# Patient Record
Sex: Male | Born: 1970 | Race: White | Hispanic: No | Marital: Married | Smoking: Never smoker
Health system: Southern US, Community
[De-identification: ages and names within clinical notes are randomized; demographics above are authoritative.]

## PROBLEM LIST (undated history)

## (undated) DIAGNOSIS — T7840XA Allergy, unspecified, initial encounter: Secondary | ICD-10-CM

## (undated) DIAGNOSIS — I82409 Acute embolism and thrombosis of unspecified deep veins of unspecified lower extremity: Secondary | ICD-10-CM

## (undated) DIAGNOSIS — Z8619 Personal history of other infectious and parasitic diseases: Secondary | ICD-10-CM

## (undated) DIAGNOSIS — J45909 Unspecified asthma, uncomplicated: Secondary | ICD-10-CM

## (undated) HISTORY — DX: Unspecified asthma, uncomplicated: J45.909

## (undated) HISTORY — DX: Allergy, unspecified, initial encounter: T78.40XA

## (undated) HISTORY — DX: Personal history of other infectious and parasitic diseases: Z86.19

## (undated) HISTORY — DX: Acute embolism and thrombosis of unspecified deep veins of unspecified lower extremity: I82.409

---

## 2009-12-15 ENCOUNTER — Encounter: Admission: RE | Admit: 2009-12-15 | Discharge: 2009-12-15 | Payer: Self-pay | Admitting: Unknown Physician Specialty

## 2011-12-08 IMAGING — US US EXTREM LOW VENOUS*L*
1 series · 14 of 24 positions shown · non-contrast
Comparison: None.

CLINICAL DATA: Left leg swelling, obesity

LEFT LOWER EXTREMITY VENOUS DUPLEX ULTRASOUND
TECHNIQUE: Gray-scale sonography with graded compression, as well
as color Doppler and duplex ultrasound were performed to evaluate
the deep venous system of the lower extremity from the level of the
common femoral vein through the popliteal and proximal calf veins.
Spectral Doppler was utilized to evaluate flow at rest and with
distal augmentation maneuvers.

[Series 1: us extrem low venous*left* · 14 of 33 slices shown]
[im 1/33]
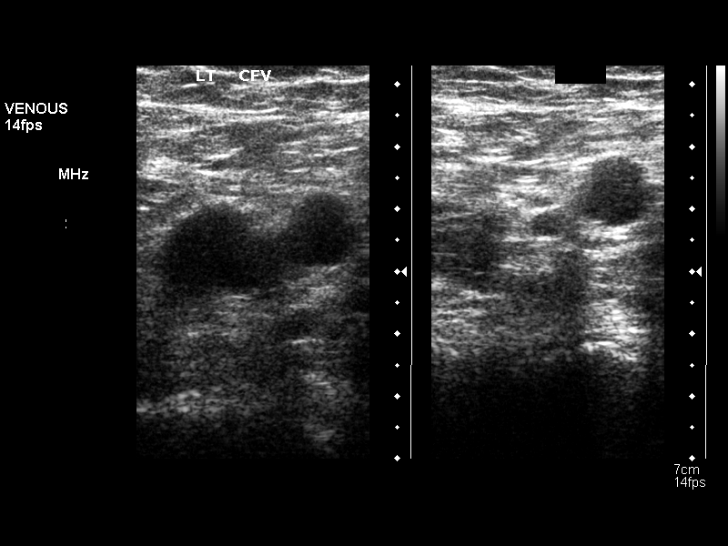
[im 3/33]
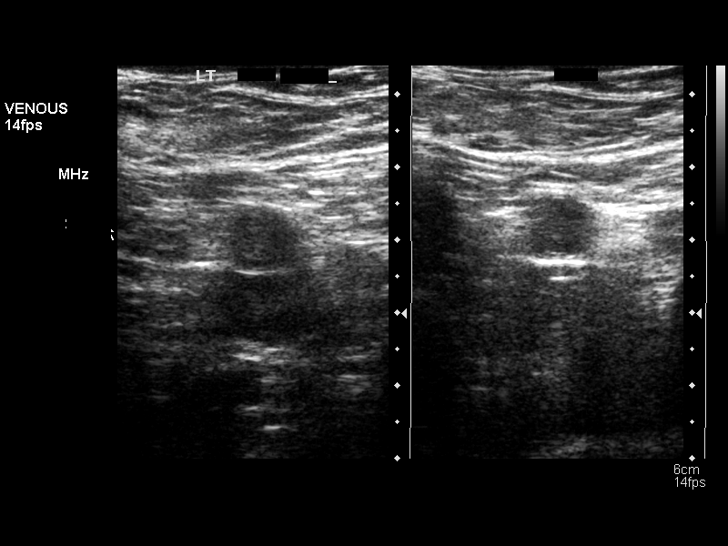
[im 6/33]
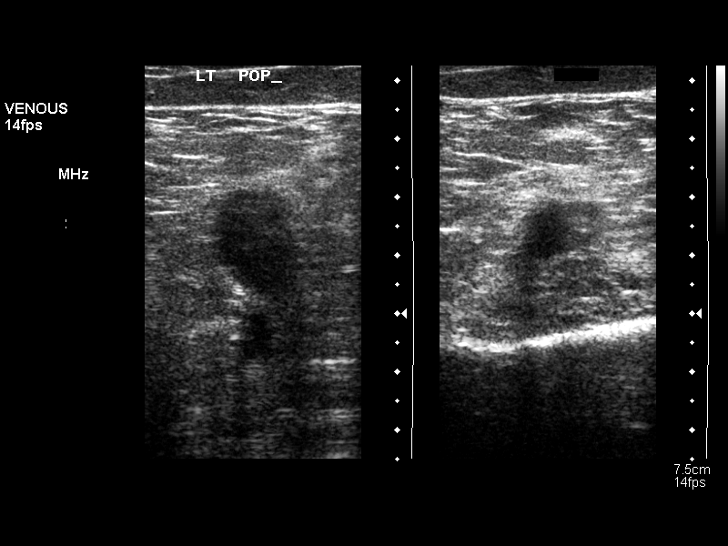
[im 9/33]
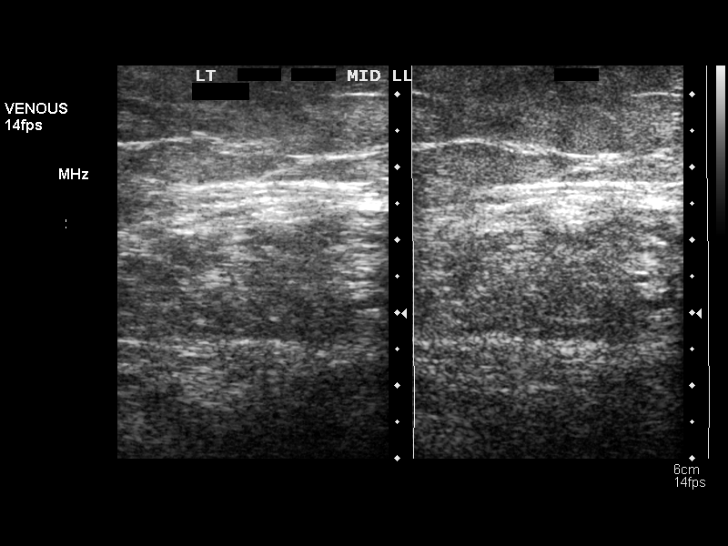
[im 10/33]
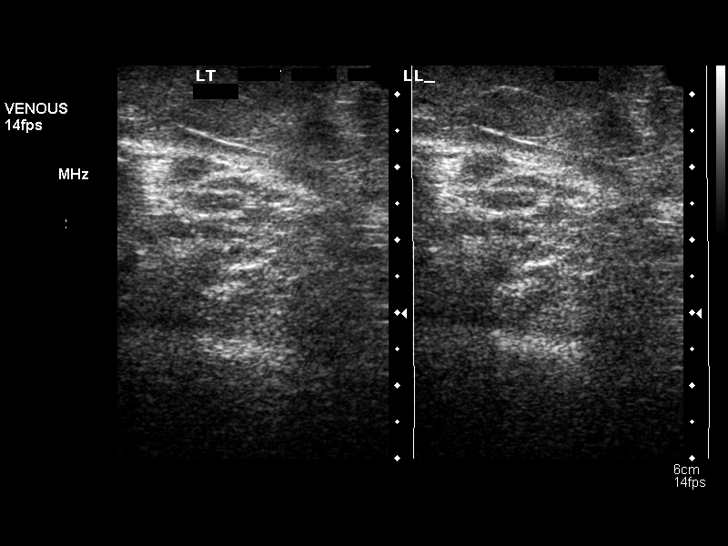
[im 13/33]
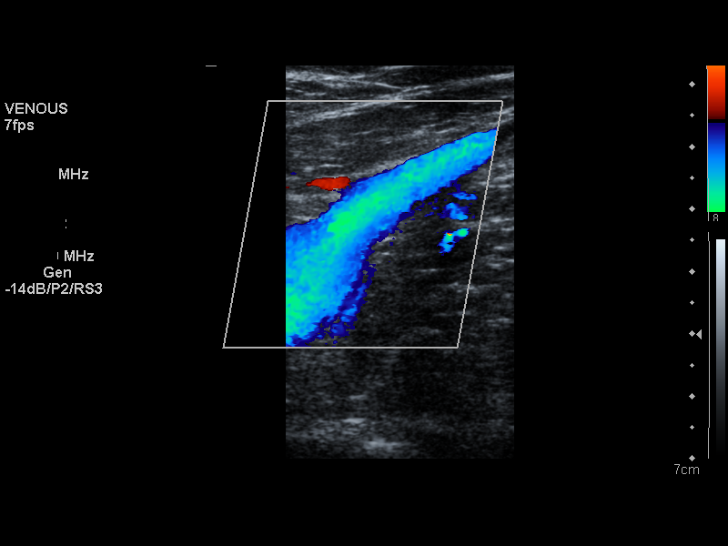
[im 16/33]
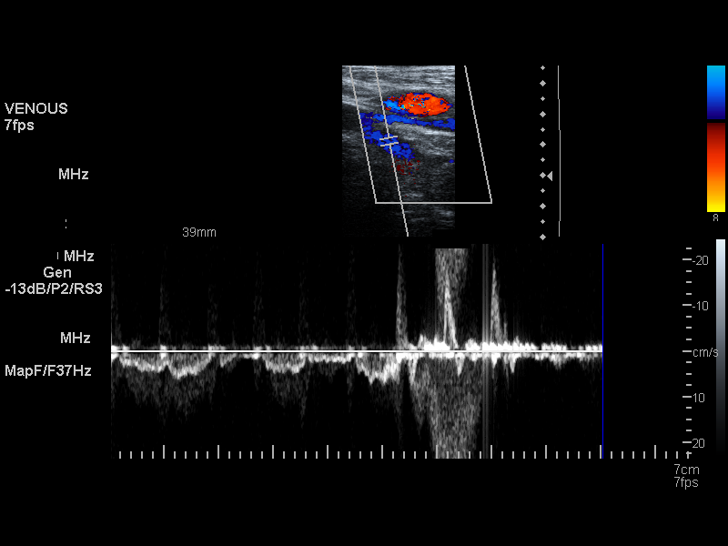
[im 17/33]
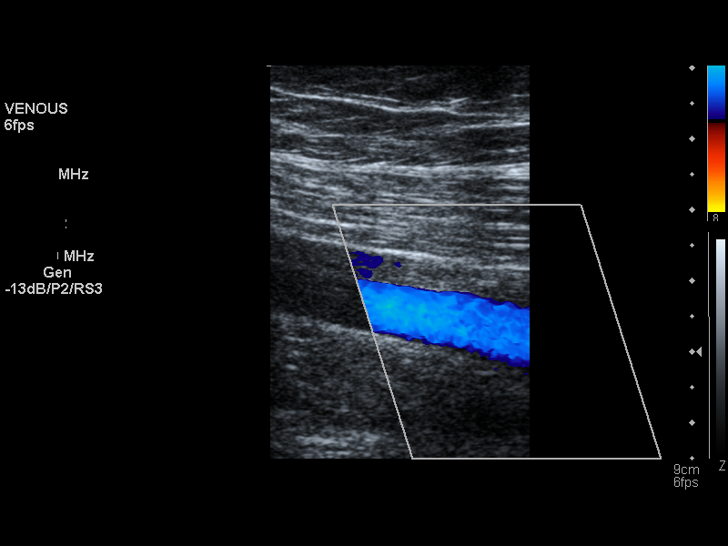
[im 20/33]
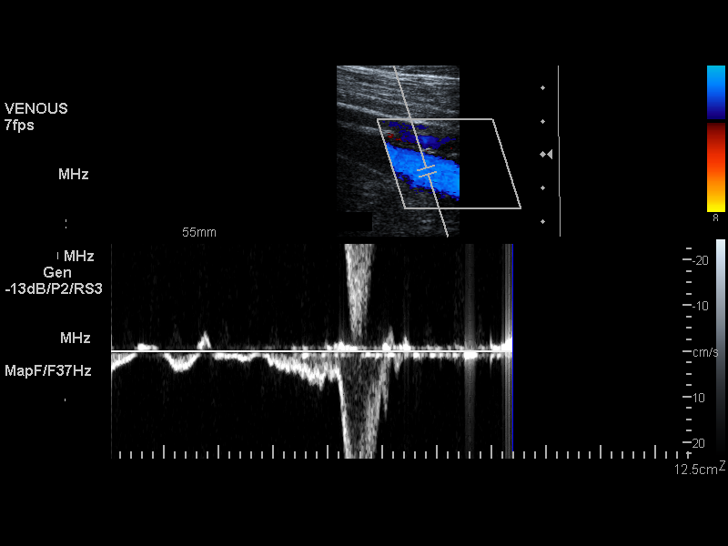
[im 23/33]
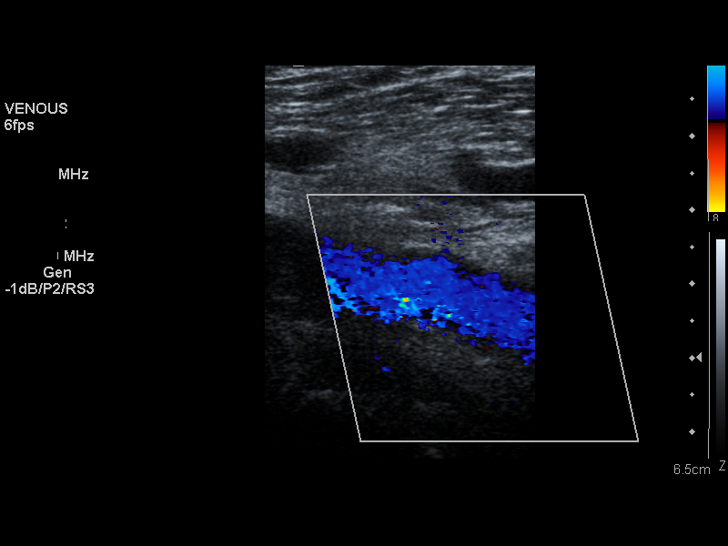
[im 26/33]
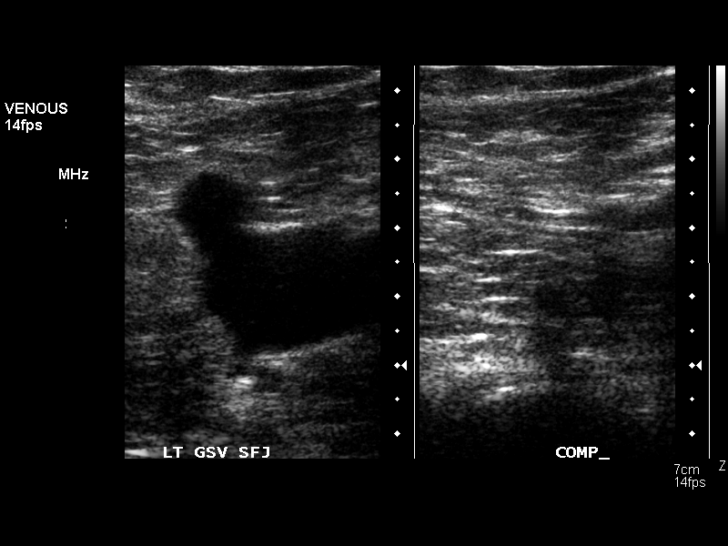
[im 27/33]
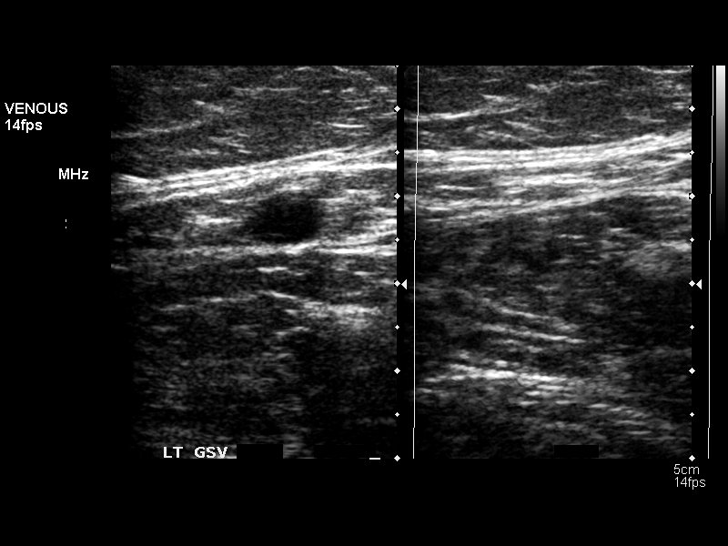
[im 30/33]
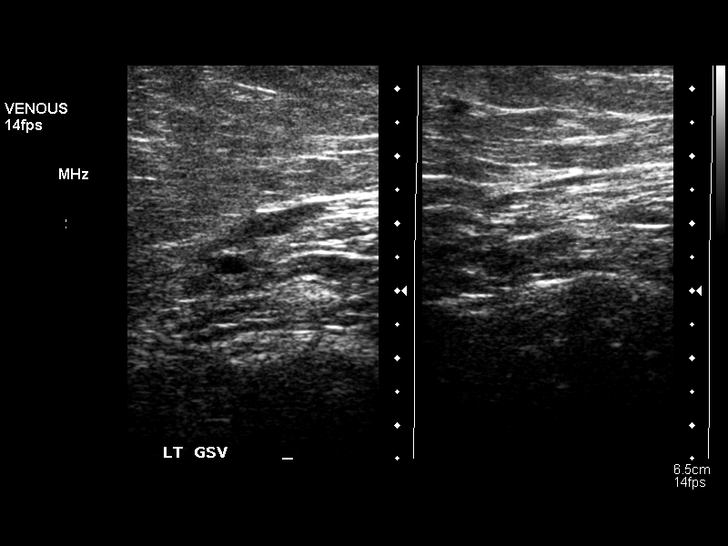
[im 33/33]
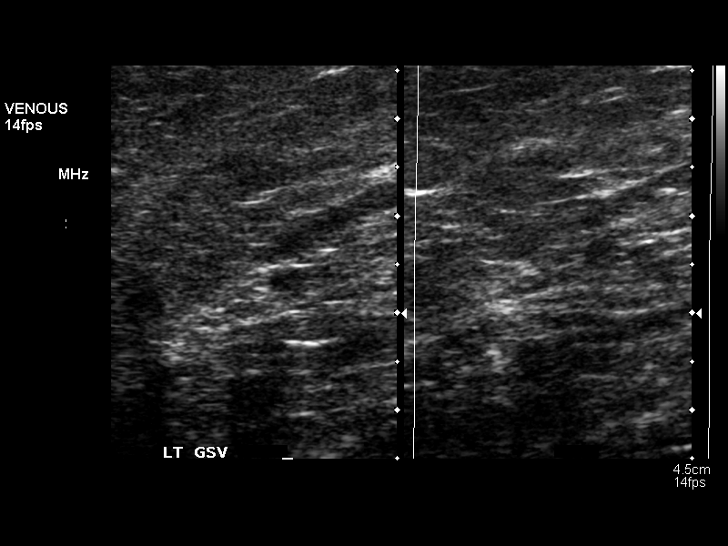

[14 of 24 positions shown; findings below may reference images not displayed]

FINDINGS: Normal compressibility of the common femoral,
superficial femoral, and popliteal veins is demonstrated, as well
as the visualized proximal calf veins.  No filling defects to
suggest DVT on grayscale or color Doppler imaging.  Doppler
waveforms show normal direction of venous flow, normal respiratory
phasicity and response to augmentation.
IMPRESSION: No evidence of lower extremity deep vein thrombosis.

## 2018-01-01 ENCOUNTER — Other Ambulatory Visit: Payer: Self-pay | Admitting: Podiatry

## 2018-01-01 ENCOUNTER — Ambulatory Visit (INDEPENDENT_AMBULATORY_CARE_PROVIDER_SITE_OTHER): Payer: BLUE CROSS/BLUE SHIELD

## 2018-01-01 ENCOUNTER — Encounter: Payer: Self-pay | Admitting: Podiatry

## 2018-01-01 ENCOUNTER — Ambulatory Visit: Payer: BLUE CROSS/BLUE SHIELD | Admitting: Podiatry

## 2018-01-01 ENCOUNTER — Other Ambulatory Visit: Payer: Self-pay

## 2018-01-01 VITALS — BP 139/89 | HR 68 | Resp 16

## 2018-01-01 DIAGNOSIS — M722 Plantar fascial fibromatosis: Secondary | ICD-10-CM | POA: Diagnosis not present

## 2018-01-01 DIAGNOSIS — M216X2 Other acquired deformities of left foot: Secondary | ICD-10-CM

## 2018-01-01 DIAGNOSIS — M7750 Other enthesopathy of unspecified foot: Secondary | ICD-10-CM | POA: Diagnosis not present

## 2018-01-01 DIAGNOSIS — M778 Other enthesopathies, not elsewhere classified: Secondary | ICD-10-CM

## 2018-01-01 DIAGNOSIS — M779 Enthesopathy, unspecified: Secondary | ICD-10-CM

## 2018-01-01 DIAGNOSIS — M2042 Other hammer toe(s) (acquired), left foot: Secondary | ICD-10-CM

## 2018-01-01 DIAGNOSIS — L84 Corns and callosities: Secondary | ICD-10-CM | POA: Diagnosis not present

## 2018-01-01 MED ORDER — MELOXICAM 15 MG PO TABS: 15.0000 mg | ORAL_TABLET | Freq: Every day | ORAL | 0 refills | Status: DC

## 2018-01-01 NOTE — Patient Instructions (Addendum)

## 2018-01-04 NOTE — Progress Notes (Signed)
Subjective:   Patient ID: Mark Gray, male   DOB: 47 y.o.   MRN: [id number]   HPI 47 year old male presents the office with concerns of pain to the bottom of his right heel which is been ongoing the last couple of months and he states this started after he eats a lot of walking while in [place].  He has pain in the morning he first gets up.  He has purchased over-the-counter inserts which help him primarily.  Denies any recent injury or trauma.  He also is a callus in the left foot submetatarsal 5.  This area is painful with pressure in shoes.  No swelling or redness that he has noticed.  No other issues today.   Review of Systems  All other systems reviewed and are negative.  No past medical history on file.     Current Outpatient Medications:  .  meloxicam (MOBIC) 15 MG tablet, Take 1 tablet (15 mg total) by mouth daily., Disp: 30 tablet, Rfl: 0  No Known Allergies       Objective:  Physical Exam  General: AAO x3, NAD  Dermatological: Hyperkeratotic lesion left fifth metatarsal.  Upon debridement there is no underlying ulceration, drainage or any clinical signs of infection present today.  There are no open sores, no preulcerative lesions, no rash or signs of infection present.  Vascular: Dorsalis Pedis artery and Posterior Tibial artery pedal pulses are 2/4 bilateral with immedate capillary fill time. Pedal hair growth present. No varicosities and no lower extremity edema present bilateral. There is no pain with calf compression, swelling, warmth, erythema.   Neruologic: Grossly intact via light touch bilateral. Vibratory intact via tuning fork bilateral. Protective threshold with Semmes Wienstein monofilament intact to all pedal sites bilateral.  Negative Tinel sign  Musculoskeletal: Tenderness to palpation along the plantar medial tubercle of the calcaneus at the insertion of plantar fascia on the right foot. There is no pain along the course of the plantar fascia  within the arch of the foot. Plantar fascia appears to be intact. There is no pain with lateral compression of the calcaneus or pain with vibratory sensation. There is no pain along the course or insertion of the achilles tendon.  Prominent metatarsal head left foot.  No other areas of tenderness to bilateral lower extremities. Muscular strength 5/5 in all groups tested bilateral.  Gait: Unassisted, Nonantalgic.       Assessment:   Right heel pain, bladder fasciitis with hyperkeratotic lesion left foot    Plan:  -Treatment options discussed including all alternatives, risks, and complications -Etiology of symptoms were discussed -X-rays were obtained and reviewed with the patient.  No evidence of acute fracture or stress fracture identified today. -Steroid injection was performed.  See procedure note below. -Prescribed mobic. Discussed side effects of the medication and directed to stop if any are to occur and call the office.  -Plantar fascial brace dispensed -Stretching, icing exercises daily -Discussed shoe modifications and orthotics.  I also debrided the hyperkeratotic lesion left foot without any complications or bleeding.  I discussed with custom orthotic may help take some pressure off this area as well.  Vivi Barrack DPM

## 2018-01-22 ENCOUNTER — Ambulatory Visit: Payer: BLUE CROSS/BLUE SHIELD | Admitting: Podiatry

## 2018-01-23 ENCOUNTER — Ambulatory Visit (INDEPENDENT_AMBULATORY_CARE_PROVIDER_SITE_OTHER): Payer: BLUE CROSS/BLUE SHIELD | Admitting: Podiatry

## 2018-01-23 ENCOUNTER — Encounter: Payer: Self-pay | Admitting: Podiatry

## 2018-01-23 DIAGNOSIS — M722 Plantar fascial fibromatosis: Secondary | ICD-10-CM | POA: Diagnosis not present

## 2018-01-23 MED ORDER — TRIAMCINOLONE ACETONIDE 10 MG/ML IJ SUSP: 10.0000 mg | Freq: Once | INTRAMUSCULAR | Status: AC

## 2018-01-23 NOTE — Progress Notes (Signed)
Subjective: Mark DrainWilliam Sweeney presents to the office today for follow-up evaluation of right heel pain. He states that he is doing better but still having pain. Overall he does state that the pain has improved. Even when it hurts the worst is better than it was. He has been stretching but not icing much. He did get new shoes that help. The braces to help. They have been icing, stretching, try to wear supportive shoe as much as possible. No other complaints at this time. No acute changes since last appointment. They deny any systemic complaints such as fevers, chills, nausea, vomiting.  Objective: General: AAO x3, NAD  Dermatological: Skin is warm, dry and supple bilateral. Nails x 10 are well manicured; remaining integument appears unremarkable at this time. There are no open sores, no preulcerative lesions, no rash or signs of infection present.  Vascular: Dorsalis Pedis artery and Posterior Tibial artery pedal pulses are 2/4 bilateral with immedate capillary fill time. Pedal hair growth present. There is no pain with calf compression, swelling, warmth, erythema.   Neruologic: Grossly intact via light touch bilateral.   Musculoskeletal: There is improved but continued tenderness palpation along the plantar medial tubercle of the calcaneus at the insertion of the plantar fascia on the  right foot. There is  no pain along the course of the plantar fascia within the arch of the foot. Plantar fascia appears to be intact bilaterally. There is no pain with lateral compression of the calcaneus and there is no pain with vibratory sensation. There is no pain along the course or insertion of the Achilles tendon. There are no other areas of tenderness to bilateral lower extremities. No gross boney pedal deformities bilateral. No pain, crepitus, or limitation noted with foot and ankle range of motion bilateral. Muscular strength 5/5 in all groups tested bilateral.  Gait: Unassisted, Nonantalgic.    Assessment: Presents for follow-up evaluation for heel pain, likely plantar fasciitis   Plan: -Treatment options discussed including all alternatives, risks, and complications -Steroid injection done today. This is his SECOND injection. See procedure note below -Continue with stretching and icing daily -Ice and stretching exercises on a daily basis. -Continue supportive shoe gear. He has OTC inserts. Consider custom if continuing to have symptoms.  -Follow-up in 3 weeks if symptoms continue or sooner if any problems arise. In the meantime, encouraged to call the office with any questions, concerns, change in symptoms.   Ovid CurdMatthew Storm Sovine, DPM

## 2018-01-28 ENCOUNTER — Other Ambulatory Visit: Payer: Self-pay | Admitting: Podiatry

## 2018-02-13 ENCOUNTER — Ambulatory Visit: Payer: BLUE CROSS/BLUE SHIELD | Admitting: Podiatry

## 2018-03-03 ENCOUNTER — Ambulatory Visit: Payer: BLUE CROSS/BLUE SHIELD | Admitting: Podiatry

## 2021-04-27 ENCOUNTER — Other Ambulatory Visit: Payer: Self-pay | Admitting: Family Medicine

## 2021-04-27 DIAGNOSIS — S0990XA Unspecified injury of head, initial encounter: Secondary | ICD-10-CM

## 2022-08-08 ENCOUNTER — Other Ambulatory Visit: Payer: Self-pay | Admitting: Internal Medicine

## 2022-08-08 ENCOUNTER — Ambulatory Visit (HOSPITAL_COMMUNITY)
Admission: RE | Admit: 2022-08-08 | Discharge: 2022-08-08 | Disposition: A | Discharge status: Home / Self Care | Payer: 59 | Source: Ambulatory Visit | Attending: Internal Medicine | Admitting: Internal Medicine

## 2022-08-08 DIAGNOSIS — M79604 Pain in right leg: Secondary | ICD-10-CM

## 2022-08-08 DIAGNOSIS — M79605 Pain in left leg: Secondary | ICD-10-CM

## 2022-08-08 NOTE — Progress Notes (Addendum)
VASCULAR LAB    Left lower extremity venous duplex has been performed.  See CV proc for preliminary results.  Called report to Marcy Salvo, PA-C  Murl Golladay, [facility], RVT 08/08/2022, 4:29 PM

## 2022-08-26 ENCOUNTER — Encounter: Payer: Self-pay | Admitting: Family Medicine

## 2022-08-26 ENCOUNTER — Ambulatory Visit: Payer: 59 | Admitting: Family Medicine

## 2022-08-26 VITALS — BP 108/74 | HR 65 | Temp 98.6°F | Ht 69.75 in | Wt 330.0 lb

## 2022-08-26 DIAGNOSIS — I82812 Embolism and thrombosis of superficial veins of left lower extremities: Secondary | ICD-10-CM | POA: Diagnosis not present

## 2022-08-26 DIAGNOSIS — I872 Venous insufficiency (chronic) (peripheral): Secondary | ICD-10-CM

## 2022-08-26 MED ORDER — APIXABAN 5 MG PO TABS: ORAL_TABLET | ORAL | 1 refills | Status: DC

## 2022-08-26 NOTE — Assessment & Plan Note (Addendum)
Patient had a superficial clot in the greater saphenous vein on the left from the mid calf to the distal thigh. We discussed the possible need for full anticoagulation given that this in the greater saphenous vein, >5 cm in length and it extends above the knee. he is

## 2022-08-26 NOTE — Patient Instructions (Signed)
[facility] -- [phone] -- call and ask about the referral placed.

## 2022-08-26 NOTE — Progress Notes (Unsigned)
New Patient Office Visit  Subjective    Patient ID: Mark Gray, male    DOB: 20-Nov-1970  Age: 52 y.o. MRN: [id number]  CC:  Chief Complaint  Patient presents with   Establish Care    HPI Mark Gray presents to establish care Patient was being seen by a provider at work, [employer]. He reports that he has ben having a month history of left leg pain, swelling and redness. He was seen on 08/08/22 and he was diagnosed with a superficial blood clot in the left leg. Pt reports he is taking an aspirin daily and he was told that he was getting referred to the vascular center.  Pt reports that he hasn't heard back from the vascular center with an appointment. He reports a long history of chronic swelling due to venous insufficiency, states that he normally wraps his legs with Ace bandages to help keep the swelling down. States that he has been using NSAIDS, hot compresses and massages on the medial calf and thigh, states that it has improved significantly since it was first diagnosed, however he thought that the hardness on the distal thigh was getting somewhat worse.   I reviewed the [place] and it showed a superfiical clot in the left greater saphenous vein from the calf all the way up to the distal thigh.  Outpatient Encounter Medications as of 08/26/2022  Medication Sig   albuterol (VENTOLIN HFA) 108 (90 Base) MCG/ACT inhaler Inhale into the lungs every 6 (six) hours as needed for wheezing or shortness of breath.   apixaban (ELIQUIS) 5 MG TABS tablet Start with 10 mg twice a day for 7 days, then reduce to 5 mg twice a day.   aspirin EC 81 MG tablet Take 81 mg by mouth daily. Swallow whole.   Cetirizine HCl (ZYRTEC PO) Take by mouth as needed.   Multiple Vitamins-Minerals (MULTIVITAMIN ADULTS PO) Take by mouth daily.   [DISCONTINUED] meloxicam (MOBIC) 15 MG tablet TAKE 1 TABLET BY MOUTH EVERY DAY   Facility-Administered Encounter Medications as of 08/26/2022  Medication   triamcinolone  acetonide (KENALOG) 10 MG/ML injection 10 mg    Past Medical History:  Diagnosis Date   Allergy    Asthma    DVT (deep venous thrombosis) (HCC)    LLE 07/2022 per patient   History of chickenpox     History reviewed. No pertinent surgical history.  Family History  Problem Relation Age of Onset   Cancer Mother    Heart attack Mother    CVA Father    Depression Son     Social History   Socioeconomic History   Marital status: Married    Spouse name: Not on file   Number of children: Not on file   Years of education: Not on file   Highest education level: Not on file  Occupational History   Not on file  Tobacco Use   Smoking status: Never   Smokeless tobacco: Never  Vaping Use   Vaping Use: Never used  Substance and Sexual Activity   Alcohol use: Yes    Comment: 1-2 drinks per week occasionally   Drug use: Never   Sexual activity: Yes  Other Topics Concern   Not on file  Social History Narrative   Not on file   Social Determinants of Health   Financial Resource Strain: Not on file  Food Insecurity: Not on file  Transportation Needs: Not on file  Physical Activity: Not on file  Stress: Not on file  Social Connections: Not on file  Intimate Partner Violence: Not on file    Review of Systems  All other systems reviewed and are negative.       Objective    BP 108/74 (BP Location: Left Arm, Patient Position: Sitting, Cuff Size: Large)   Pulse 65   Temp 98.6 F (37 C) (Oral)   Ht 5' 9.75" (1.772 m)   Wt (!) 330 lb (149.7 kg)   SpO2 98%   BMI 47.69 kg/m   Physical Exam Vitals reviewed.  Constitutional:      Appearance: Normal appearance. He is well-groomed. He is morbidly obese.  Neck:     Thyroid: No thyromegaly.  Cardiovascular:     Rate and Rhythm: Normal rate and regular rhythm.     Heart sounds: S1 normal and S2 normal. No murmur heard. Pulmonary:     Effort: Pulmonary effort is normal.     Breath sounds: Normal breath sounds and air  entry. No rales.  Abdominal:     General: Bowel sounds are normal.  Musculoskeletal:     Right lower leg: No edema.     Left lower leg: Edema present.       Legs:     Comments: Patient has chronic lymphedema changes around the left ankle with hyperpigmentation of the swollen area. There is a hard rope like vein extending up the medial side of the calf up to the distal thigh about the knee.  Neurological:     General: No focal deficit present.     Mental Status: He is alert and oriented to person, place, and time.     Gait: Gait is intact.  Psychiatric:        Mood and Affect: Mood and affect normal.         Assessment & Plan:   Problem List Items Addressed This Visit       Unprioritized   Acute superficial venous thrombosis of lower extremity, left - Primary (Chronic)    Patient had a superficial clot in the greater saphenous vein on the left from the mid calf to the distal thigh. We discussed the possible need for full anticoagulation given that this in the greater saphenous vein, >5 cm in length and it extends above the knee. he has already been referred to the vascular center and is waiting to hear back. I recommended that we go ahead and start full anticoagulation given that this superificial clot is at high risk of getting into the deep vein system given the quality and the location of the clot. Pt will start 10 mg Eliquis BID for 7 days, then reduce to 5 mg BID for 45 days. Further orders will be determined by the vascular specialists.       Relevant Medications   aspirin EC 81 MG tablet   apixaban (ELIQUIS) 5 MG TABS tablet   Venous insufficiency of both lower extremities    Chronic, with evidence of lymphedematous  skin changes in the left ankle. Pt was advised to continue compression therapy.       Relevant Medications   aspirin EC 81 MG tablet   apixaban (ELIQUIS) 5 MG TABS tablet    Return in about 10 weeks (around 11/04/2022) for follow up sometime this week for  his venous insufficiency.   [name], MD

## 2022-08-27 NOTE — Assessment & Plan Note (Signed)
Chronic, with evidence of lymphedematous  skin changes in the left ankle. Pt was advised to continue compression therapy.

## 2022-08-28 ENCOUNTER — Other Ambulatory Visit: Payer: Self-pay

## 2022-08-28 DIAGNOSIS — I872 Venous insufficiency (chronic) (peripheral): Secondary | ICD-10-CM

## 2022-08-30 ENCOUNTER — Ambulatory Visit: Payer: 59 | Admitting: Surgery

## 2022-08-30 ENCOUNTER — Encounter: Payer: Self-pay | Admitting: Surgery

## 2022-08-30 ENCOUNTER — Ambulatory Visit (HOSPITAL_COMMUNITY)
Admission: RE | Admit: 2022-08-30 | Discharge: 2022-08-30 | Disposition: A | Discharge status: Home / Self Care | Payer: 59 | Source: Ambulatory Visit | Attending: Cardiology | Admitting: Cardiology

## 2022-08-30 VITALS — BP 139/91 | HR 70 | Temp 98.4°F | Resp 20 | Ht 69.5 in | Wt 328.0 lb

## 2022-08-30 DIAGNOSIS — I872 Venous insufficiency (chronic) (peripheral): Secondary | ICD-10-CM | POA: Insufficient documentation

## 2022-08-30 DIAGNOSIS — I82432 Acute embolism and thrombosis of left popliteal vein: Secondary | ICD-10-CM

## 2022-08-30 NOTE — Progress Notes (Signed)
[facility]  Patient name: Mark Gray MRN: [id number] DOB: 10-20-1970 Sex: male   REQUESTING PROVIDER:    Blondell Reveal    REASON FOR CONSULT:    Venous reflux  HISTORY OF PRESENT ILLNESS:   Mark Gray is a 52 y.o. male, who is referred today for venous disease.  He has had approximate 10-year history of chronic left leg swelling.  He has tried compression socks in the past but they cut the circulation off of the ankle.  He has  edema and the right leg but it is not near as severe as the left.  There was no inciting factor.  Approximate 3 weeks ago, he noticed worsening swelling with a palpable cord.  He went to the emergency department and was diagnosed with superficial venous thrombophlebitis in the saphenous vein.  He was not started on anticoagulation at that time but saw his primary care provider several days ago who ultimately started Eliquis.  He comes in today for vascular valuation.  He got a repeat ultrasound that shows popliteal thrombus.  His DVT appears to be unprovoked.  There were no aggravating factors.  He has not had a prior DVT.  There is no family history.  He is obese, but has lost 25 pounds intentionally over the past year.  He is a non-smoker  PAST MEDICAL HISTORY    Past Medical History:  Diagnosis Date   Allergy    Asthma    DVT (deep venous thrombosis) (HCC)    LLE 07/2022 per patient   History of chickenpox      FAMILY HISTORY   Family History  Problem Relation Age of Onset   Cancer Mother    Heart attack Mother    CVA Father    Depression Son     SOCIAL HISTORY:   Social History   Socioeconomic History   Marital status: Married    Spouse name: Not on file   Number of children: Not on file   Years of education: Not on file   Highest education level: Not on file  Occupational History   Not on file  Tobacco Use   Smoking status: Never   Smokeless tobacco: Never  Vaping Use   Vaping Use:  Never used  Substance and Sexual Activity   Alcohol use: Yes    Comment: 1-2 drinks per week occasionally   Drug use: Never   Sexual activity: Yes  Other Topics Concern   Not on file  Social History Narrative   Not on file   Social Determinants of Health   Financial Resource Strain: Not on file  Food Insecurity: Not on file  Transportation Needs: Not on file  Physical Activity: Not on file  Stress: Not on file  Social Connections: Not on file  Intimate Partner Violence: Not on file    ALLERGIES:    Allergies  Allergen Reactions   Shellfish Allergy     Causes asthma attack    CURRENT MEDICATIONS:    Current Outpatient Medications  Medication Sig Dispense Refill   albuterol (VENTOLIN HFA) 108 (90 Base) MCG/ACT inhaler Inhale into the lungs every 6 (six) hours as needed for wheezing or shortness of breath.     apixaban (ELIQUIS) 5 MG TABS tablet Start with 10 mg twice a day for 7 days, then reduce to 5 mg twice a day. 60 tablet 1   aspirin EC 81 MG tablet Take 81 mg by mouth daily. Swallow whole.  Cetirizine HCl (ZYRTEC PO) Take by mouth as needed.     Multiple Vitamins-Minerals (MULTIVITAMIN ADULTS PO) Take by mouth daily.     Current Facility-Administered Medications  Medication Dose Route Frequency Provider Last Rate Last Admin   triamcinolone acetonide (KENALOG) 10 MG/ML injection 10 mg  10 mg Other Once Trula Slade, DPM        REVIEW OF SYSTEMS:   [X]  denotes positive finding, [ ]  denotes negative finding Cardiac  Comments:  Chest pain or chest pressure:    Shortness of breath upon exertion:    Short of breath when lying flat:    Irregular heart rhythm:        Vascular    Pain in calf, thigh, or hip brought on by ambulation:    Pain in feet at night that wakes you up from your sleep:     Blood clot in your veins:    Leg swelling:  x       Pulmonary    Oxygen at home:    Productive cough:     Wheezing:         Neurologic    Sudden  weakness in arms or legs:     Sudden numbness in arms or legs:     Sudden onset of difficulty speaking or slurred speech:    Temporary loss of vision in one eye:     Problems with dizziness:         Gastrointestinal    Blood in stool:      Vomited blood:         Genitourinary    Burning when urinating:     Blood in urine:        Psychiatric    Major depression:         Hematologic    Bleeding problems:    Problems with blood clotting too easily:        Skin    Rashes or ulcers:        Constitutional    Fever or chills:     PHYSICAL EXAM:   Vitals:   08/30/22 1414  BP: (!) 139/91  Pulse: 70  Resp: 20  Temp: 98.4 F (36.9 C)  SpO2: 95%  Weight: (!) 328 lb (148.8 kg)  Height: 5' 9.5" (1.765 m)    GENERAL: The patient is a well-nourished male, in no acute distress. The vital signs are documented above. CARDIAC: There is a regular rate and rhythm.  VASCULAR: Significant left leg edema with hyperpigmentation of the left medial ankle.  Pitting edema to the right leg but not near as severe PULMONARY: Nonlabored respirations MUSCULOSKELETAL: There are no major deformities or cyanosis. NEUROLOGIC: No focal weakness or paresthesias are detected. SKIN: See photo below PSYCHIATRIC: The patient has a normal affect.  STUDIES:   I have reviewed the following study: Left:  - Color duplex evaluation of the left lower extremity shows there is  thrombus in the popliteal vein, the gastrocnemius veins and the great  saphenous vein. The great saphenous vein thrombus appears to extend from  the mid calf to the mid/distal thigh.      ASSESSMENT and PLAN   Left leg DVT: This appears to be unprovoked.  He has been started on Eliquis.  I am making a referral to hematology for hypercoagulable workup.  Nothing suggest a underlying malignancy at this time.  I am also sending him to the DVT clinic for further management of his anticoagulation.  We are  going to try to get him into  compression socks once some of the edema goes down.  He was encouraged to keep his legs elevated when possible   Annamarie Major, IV, MD, FACS [facility] [phone] Pager [phone]

## 2022-09-05 ENCOUNTER — Telehealth (HOSPITAL_COMMUNITY): Payer: Self-pay

## 2022-09-06 ENCOUNTER — Inpatient Hospital Stay: Payer: 59

## 2022-09-06 ENCOUNTER — Inpatient Hospital Stay (HOSPITAL_BASED_OUTPATIENT_CLINIC_OR_DEPARTMENT_OTHER): Payer: 59 | Admitting: Physician Assistant

## 2022-09-06 ENCOUNTER — Inpatient Hospital Stay: Payer: 59 | Attending: Hematology and Oncology | Admitting: Hematology and Oncology

## 2022-09-06 ENCOUNTER — Encounter: Payer: Self-pay | Admitting: Hematology and Oncology

## 2022-09-06 ENCOUNTER — Other Ambulatory Visit: Payer: Self-pay

## 2022-09-06 VITALS — BP 135/83 | HR 76 | Temp 97.7°F | Resp 16 | Ht 69.0 in | Wt 329.0 lb

## 2022-09-06 DIAGNOSIS — Z7901 Long term (current) use of anticoagulants: Secondary | ICD-10-CM | POA: Insufficient documentation

## 2022-09-06 DIAGNOSIS — R55 Syncope and collapse: Secondary | ICD-10-CM

## 2022-09-06 DIAGNOSIS — I82432 Acute embolism and thrombosis of left popliteal vein: Secondary | ICD-10-CM

## 2022-09-06 LAB — ANTITHROMBIN III: AntiThromb III Func: 94 % (ref 75–120)

## 2022-09-06 NOTE — Progress Notes (Signed)
[name] CONSULT NOTE  Patient Care Team: [name], MD as PCP - General (Family Medicine)  CHIEF COMPLAINTS/PURPOSE OF CONSULTATION:  Hypercoagulable work up.  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  He is currently on anticoagulation with Eliquis, tolerating it very well.  No clear provoking factors. Physical examination today with no major concerns except chronic venous stasis changes and left lower extremity asymmetrical swelling which according to the patient has been there for almost 10 years.  We have discussed in detail about the virtual striate and the risk factors for DVT and PE.  He appears to have no significant family history of DVT.  We have recommended hypercoagulable workup since he travels internationally very often for his work and is keen to know the duration of anticoagulation and if he would benefit from long-term anticoagulation.   He also mentions having gotten the COVID booster shot recently within 4 weeks of this DVT. He understands that hypercoagulable workup may not yield any positive results.  Since this is his first episode of unprovoked DVT, he can consider anticoagulation for 3 to 6 months followed by baby aspirin.  If he does end up having a second episode of DVT/PE, he may need indefinite anticoagulation.  We have briefly discussed about risk factors for DVT/PE, risk of recurrence after completing anticoagulation.  Thank you for consulting [place] in the care of the patient. He was planning to obtain Eliquis refill from [facility]. We will call him 2 weeks to review results from today  During the blood draw he had a syncopal episode hence we could not obtain factor V Leiden mutation.  He could have this lab drawn at the DVT clinic.  HISTORY OF PRESENTING ILLNESS:   Mark Gray 52 y.o. male is here because of hypercoagulable work up.  This is a 52 year old male patient with no significant past medical history most recently diagnosed with left lower extremity DVT in the popliteal vein.  Many weeks prior to the presentation he traveled to [place] in the car for about 8 hours.  About 3 weeks after this trip he noticed that his left lower extremity which is already swollen at baseline has become even more swollen.  He had imaging which showed superficial vein thrombosis.  He was not started on any anticoagulation for SVT.  He then went to see his PCP who recommended anticoagulation for short while and given the SVT and extremity swelling.  He was also referred to vascular surgery and he had a repeat imaging which showed thrombus in the popliteal vein, gastrocnemius and great saphenous vein.  The great saphenous vein thrombus appears to extend from the mid calf to mid distal thigh.  He denies any obvious provoking factors.  No recent trauma or surgery.  He is obese at baseline however has been trying to lose weight intentionally, lost about 30 to 40 pounds in the past several months.  He has a sedentary job, as a Hotel manager for [employer].  He also travels internationally and has been on several international trips without any DVTs.  He is now on Eliquis and has been tolerating it really well.  He is hoping to go for trip and spring break with his kids.  No family history of known hypercoagulable disorders.  No testosterone supplementation.  Rest of the pertinent 10 point ROS reviewed and negative   MEDICAL HISTORY:  Past Medical History:  Diagnosis Date   Allergy    Asthma    DVT (deep venous thrombosis) (HCC)    LLE 07/2022 per patient   History of chickenpox     SURGICAL HISTORY: No past surgical history on file.  SOCIAL HISTORY: Social History   Socioeconomic History   Marital status: Married    Spouse name: Not on file    Number of children: Not on file   Years of education: Not on file   Highest education level: Not on file  Occupational History   Not on file  Tobacco Use   Smoking status: Never   Smokeless tobacco: Never  Vaping Use   Vaping Use: Never used  Substance and Sexual Activity   Alcohol use: Yes    Comment: 1-2 drinks per week occasionally   Drug use: Never   Sexual activity: Yes  Other Topics Concern   Not on file  Social History Narrative   Not on file   Social Determinants of Health   Financial Resource Strain: Not on file  Food Insecurity: Not on file  Transportation Needs: Not on file  Physical Activity: Not on file  Stress: Not on file  Social Connections: Not on file  Intimate Partner Violence: Not on file    FAMILY HISTORY: Family History  Problem Relation Age of Onset   Cancer Mother    Heart attack Mother    CVA Father    Depression Son     ALLERGIES:  is allergic to shellfish allergy.  MEDICATIONS:  Current Outpatient Medications  Medication Sig Dispense Refill   albuterol (VENTOLIN HFA) 108 (90 Base) MCG/ACT inhaler Inhale into the lungs every 6 (six) hours as needed for wheezing or shortness of breath.     apixaban (ELIQUIS) 5 MG TABS tablet Start with 10 mg twice a day for 7 days, then reduce to 5 mg twice a day. 60 tablet 1   aspirin EC 81 MG tablet Take 81 mg by mouth daily. Swallow whole.     Cetirizine HCl (ZYRTEC PO) Take by mouth as needed.     Multiple Vitamins-Minerals (MULTIVITAMIN ADULTS PO) Take by mouth daily.     Current Facility-Administered Medications  Medication Dose Route Frequency Provider Last Rate Last Admin   triamcinolone acetonide (KENALOG) 10 MG/ML injection 10 mg  10 mg Other Once Vivi Barrack, DPM         PHYSICAL EXAMINATION: ECOG PERFORMANCE STATUS: 0 - Asymptomatic  Vitals:   09/06/22 1052  BP: 135/83  Pulse: 76  Resp: 16  Temp: 97.7 F (36.5 C)  SpO2: 100%   Filed Weights   09/06/22 1052  Weight:  (!) 329 lb (149.2 kg)    GENERAL:alert, no distress and comfortable, obese EYES: normal, conjunctiva are pink and non-injected, sclera clear NECK: supple,  non-tender, without nodularity LYMPH:  no palpable lymphadenopathy in the cervical, axillary  LUNGS: clear to auscultation and percussion with normal breathing effort HEART: regular rate & rhythm and no murmurs and no lower extremity edema ABDOMEN:abdomen soft, non-tender and normal bowel sounds Musculoskeletal:no cyanosis of digits and no clubbing  PSYCH: alert & oriented x 3 with fluent speech NEURO: no focal motor/sensory deficits Lower extremities: Bilateral lower extremities inspected.  He has at baseline left lower extremity swelling with venous stasis and hyperpigmentation.  He says since initiation of anticoagulation, the swelling is improved.  Right lower extremity  also has some edema but is pretty much at baseline. LABORATORY DATA:  I have reviewed the data as listed No results found for: "WBC", "HGB", "HCT", "MCV", "PLT"   Chemistry   No results found for: "NA", "K", "CL", "CO2", "BUN", "CREATININE", "GLU" No results found for: "CALCIUM", "ALKPHOS", "AST", "ALT", "BILITOT"     RADIOGRAPHIC STUDIES: I have personally reviewed the radiological images as listed and agreed with the findings in the report. VAS US LOWER EXTREMITY VENOUS REFLUX  Result Date: 08/30/2022  Lower Venous Reflux Study Patient Name:  Stacey DrainWILLIAM Freelove  Date of Exam:   08/30/2022 Medical Rec #: [id number]          Accession #:    [id number] Date of Birth: 04/28/1971          Patient Gender: M Patient Age:   [age] years Exam Location:  [facility] Procedure:      VAS US LOWER EXTREMITY VENOUS REFLUX Referring Phys: JOSHUA ROBINS --------------------------------------------------------------------------------  Other Indications: Patient presents for evaluation of GSV thrombus and venous                    insufficiency of the left leg. He was found to have thrombus                     in the left GSV that extends from the mid calf to the distal                    thigh on 08/08/22. He was not started on Eliquis until a few                    days ago after he saw a different doctor who recommended                    anticoagulation and referred him to [facility]. Anticoagulation: Eliquis. Comparison Study: [facility] lower [facility]                   [place] on 08/08/22 showed acute superficial vein                   thrombosis of the left great saphenous vein extending from the                   mid calf to the distal thigh. Performing Technologist: Olegario Sheareranielle Schmitt RVT  Examination Guidelines: A complete evaluation includes B-mode imaging, spectral Doppler, color Doppler, and power Doppler as needed of all accessible portions of each vessel. Bilateral testing is considered an integral part of a complete examination. Limited examinations for reoccurring indications may be performed as noted. The reflux portion of the exam is performed with the patient in reverse Trendelenburg. Significant venous reflux is defined as >500 ms in the superficial venous system, and >1 second in the deep venous system.  +--------------+---------+------+-----------+------------+--------------+ LEFT          Reflux NoRefluxReflux TimeDiameter cmsComments                               Yes                                        +--------------+---------+------+-----------+------------+--------------+ CFV  no                                                   +--------------+---------+------+-----------+------------+--------------+ FV prox       no                                                   +--------------+---------+------+-----------+------------+--------------+ FV mid        no                                                   +--------------+---------+------+-----------+------------+--------------+ FV  dist       no                                                   +--------------+---------+------+-----------+------------+--------------+ Popliteal     no                                    acute thrombus +--------------+---------+------+-----------+------------+--------------+ GSV at [facility]    no                            0.64                   +--------------+---------+------+-----------+------------+--------------+ GSV prox thighno                            0.48                   +--------------+---------+------+-----------+------------+--------------+ GSV mid thigh                                       acute thrombus +--------------+---------+------+-----------+------------+--------------+ GSV dist thigh                                      acute thrombus +--------------+---------+------+-----------+------------+--------------+ GSV at knee                                         acute thrombus +--------------+---------+------+-----------+------------+--------------+ GSV prox calf                                       acute thrombus +--------------+---------+------+-----------+------------+--------------+ GSV mid calf  acute thrombus +--------------+---------+------+-----------+------------+--------------+ SSV prox calf no                                                   +--------------+---------+------+-----------+------------+--------------+  Findings reported to Dr. Myra Gianotti and Thereasa Parkin at 12:15 pm.  Summary: Left: - Color duplex evaluation of the left lower extremity shows there is thrombus in the popliteal vein, the gastrocnemius veins and the great saphenous vein. The great saphenous vein thrombus appears to extend from the mid calf to the mid/distal thigh.  Recommendations: VASCULAR CONSULT IS INDICATED. Patient is seeing Dr. Linnell Fulling 08/30/22 at 2:20 pm.  *See table(s) above for measurements and  observations. Electronically signed by Sherald Hess MD on 08/30/2022 at 2:07:08 PM.    Final    VAS [place] LOWER EXTREMITY VENOUS (DVT)  Result Date: 08/09/2022  Lower Venous DVT Study Patient Name:  EATHEN BUDREAU  Date of Exam:   08/08/2022 Medical Rec #: [id number]          Accession #:    [id number] Date of Birth: 01-05-1971          Patient Gender: M Patient Age:   30 years Exam Location:  [facility] Procedure:      VAS [place] LOWER EXTREMITY VENOUS (DVT) Referring Phys: Blondell Reveal --------------------------------------------------------------------------------  Indications: Pain, Swelling, Erythema, and Palpable Cord.  Comparison Study: No prior study on file Performing Technologist: Sherren Kerns RVS  Examination Guidelines: A complete evaluation includes B-mode imaging, spectral Doppler, color Doppler, and power Doppler as needed of all accessible portions of each vessel. Bilateral testing is considered an integral part of a complete examination. Limited examinations for reoccurring indications may be performed as noted. The reflux portion of the exam is performed with the patient in reverse Trendelenburg.  +-----+---------------+---------+-----------+----------+--------------+ RIGHTCompressibilityPhasicitySpontaneityPropertiesThrombus Aging +-----+---------------+---------+-----------+----------+--------------+ CFV  Full           Yes      Yes                                 +-----+---------------+---------+-----------+----------+--------------+   +---------+---------------+---------+-----------+----------+--------------+ LEFT     CompressibilityPhasicitySpontaneityPropertiesThrombus Aging +---------+---------------+---------+-----------+----------+--------------+ CFV      Full           Yes      Yes                                 +---------+---------------+---------+-----------+----------+--------------+ SFJ      Full                                                         +---------+---------------+---------+-----------+----------+--------------+ FV Prox  Full                                                        +---------+---------------+---------+-----------+----------+--------------+ FV Mid   Full                                                        +---------+---------------+---------+-----------+----------+--------------+  FV DistalFull                                                        +---------+---------------+---------+-----------+----------+--------------+ PFV      Full                                                        +---------+---------------+---------+-----------+----------+--------------+ POP      Full           Yes      Yes                                 +---------+---------------+---------+-----------+----------+--------------+ PTV      Full                                                        +---------+---------------+---------+-----------+----------+--------------+ PERO     Full                                                        +---------+---------------+---------+-----------+----------+--------------+ GSV      None                                         Acute          +---------+---------------+---------+-----------+----------+--------------+ GSV thrombus extends mid calf to distal thigh along the site of roping and erythema   Summary: RIGHT: - No evidence of common femoral vein obstruction.  LEFT: - Findings consistent with acute superficial vein thrombosis involving the left great saphenous vein. - There is no evidence of deep vein thrombosis in the lower extremity.  *See table(s) above for measurements and observations. Electronically signed by Deitra Mayo MD on 08/09/2022 at 8:11:41 AM.    Final     All questions were answered. The patient knows to call the clinic with any problems, questions or concerns. I spent 45 minutes in the care of this patient  including H and P, review of records, counseling and coordination of care.     Benay Pike, MD 09/06/2022 11:04 AM

## 2022-09-06 NOTE — Progress Notes (Signed)
Symptom Management Consult note [place]    Patient Care Team: Farrel Conners, MD as PCP - General (Family Medicine)    Name of the patient: Mark Gray  [id number]  01/09/1971   Date of visit: 09/06/2022   Chief Complaint/Reason for visit: vasovagal episode      ASSESSMENT & PLAN: Patient is a 52 y.o. male  who recently established care with Dr. Chryl Heck for hypercoagulable work up.    #) Hypercoagulable work up -  See full note from MD visit earlier today.  #) Vasovagal episode - Happened during lab draw. Had prodrome of feeling hot and dizzy. -Patient was having 14 lab tubes drawn and this happened during tube #12 when patient admitted to feeling nervous about how much blood was being drawn. -Vitals were stable and glucose 144. Patient given snack and soda and observed for 30 minutes.  -On reassessment patient is asymptomatic, at baseline and stable for discharge home.  MD was made aware.       Heme/Onc History: Oncology History   No history exists.      Interval history-: Jerris Fleer is a 52 y.o. male who recently establish care at the cancer center for hypercoagulable workup seen today in [facility] after a vasovagal episode happening just prior to exam.  Patient was having blood work drawn when this happened.  He was having 14 tubes collected and during the 12th tube he suddenly felt hot and dizzy.  Patient then had a vasovagal episode and came back to when the lab technician said his name.  This lasted less than 10 seconds.  At the time of my exam patient is asymptomatic and feels like his typical self.  He admitted to feeling anxious about being at the cancer center and having the blood work drawn especially with how many tubes were being collected.  He states he has been in good health lately, no recent illness.  He did add that he fasted for today's lab work because he was unsure if that needed to be done or not.  He denies any  drug or alcohol use.      ROS  All other systems are reviewed and are negative for acute change except as noted in the HPI.    Allergies  Allergen Reactions   Shellfish Allergy     Causes asthma attack     Past Medical History:  Diagnosis Date   Allergy    Asthma    DVT (deep venous thrombosis) (HCC)    LLE 07/2022 per patient   History of chickenpox      No past surgical history on file.  Social History   Socioeconomic History   Marital status: Married    Spouse name: Not on file   Number of children: Not on file   Years of education: Not on file   Highest education level: Not on file  Occupational History   Not on file  Tobacco Use   Smoking status: Never   Smokeless tobacco: Never  Vaping Use   Vaping Use: Never used  Substance and Sexual Activity   Alcohol use: Yes    Comment: 1-2 drinks per week occasionally   Drug use: Never   Sexual activity: Yes  Other Topics Concern   Not on file  Social History Narrative   Not on file   Social Determinants of Health   Financial Resource Strain: Not on file  Food Insecurity: Not on file  Transportation  Needs: Not on file  Physical Activity: Not on file  Stress: Not on file  Social Connections: Not on file  Intimate Partner Violence: Not on file    Family History  Problem Relation Age of Onset   Cancer Mother    Heart attack Mother    CVA Father    Depression Son      Current Outpatient Medications:    albuterol (VENTOLIN HFA) 108 (90 Base) MCG/ACT inhaler, Inhale into the lungs every 6 (six) hours as needed for wheezing or shortness of breath., Disp: , Rfl:    apixaban (ELIQUIS) 5 MG TABS tablet, Start with 10 mg twice a day for 7 days, then reduce to 5 mg twice a day., Disp: 60 tablet, Rfl: 1   aspirin EC 81 MG tablet, Take 81 mg by mouth daily. Swallow whole., Disp: , Rfl:    Cetirizine HCl (ZYRTEC PO), Take by mouth as needed., Disp: , Rfl:    Multiple Vitamins-Minerals (MULTIVITAMIN ADULTS  PO), Take by mouth daily., Disp: , Rfl:   Current Facility-Administered Medications:    triamcinolone acetonide (KENALOG) 10 MG/ML injection 10 mg, 10 mg, Other, Once, Wagoner, Lesia Sago, DPM  PHYSICAL EXAM: ECOG FS:1 - Symptomatic but completely ambulatory  T: 97.7  BP: 125/71 HR: 62  Resp:16 SpO2: 100% RA Physical Exam Vitals and nursing note reviewed.  Constitutional:      Appearance: He is well-developed. He is obese. He is not ill-appearing or toxic-appearing.  HENT:     Head: Normocephalic.     Nose: Nose normal.  Eyes:     Conjunctiva/sclera: Conjunctivae normal.  Neck:     Vascular: No JVD.  Cardiovascular:     Rate and Rhythm: Normal rate and regular rhythm.     Pulses: Normal pulses.     Heart sounds: Normal heart sounds.  Pulmonary:     Effort: Pulmonary effort is normal.     Breath sounds: Normal breath sounds.  Abdominal:     General: There is no distension.  Musculoskeletal:     Cervical back: Normal range of motion.  Skin:    General: Skin is warm and dry.  Neurological:     Mental Status: He is oriented to person, place, and time.        LABORATORY DATA: I have reviewed the data as listed     No data to display               No data to display             RADIOGRAPHIC STUDIES (from last 24 hours if applicable) I have personally reviewed the radiological images as listed and agreed with the findings in the report. No results found.      Visit Diagnosis: 1. Acute deep vein thrombosis (DVT) of popliteal vein of left lower extremity (HCC)   2. Vasovagal episode      Orders Placed This Encounter  Procedures   Factor 5 Leiden*    Standing Status:   Future    Number of Occurrences:   1    Standing Expiration Date:   09/06/2023    All questions were answered. The patient knows to call the clinic with any problems, questions or concerns. No barriers to learning was detected.  I have spent a total of 10 minutes minutes of  face-to-face and non-face-to-face time, preparing to see the patient, obtaining and/or reviewing separately obtained history, performing a medically appropriate examination, counseling and educating the patient, ordering tests,  documenting clinical information in the electronic health record, and care coordination (communications with other health care professionals or caregivers).    Thank you for allowing me to participate in the care of this patient.    Barrie Folk, PA-C Department of Hematology/Oncology [facility] at [facility] Phone: [phone]  [phone]    09/06/2022 3:23 PM

## 2022-09-06 NOTE — Progress Notes (Signed)
CBG 144, Mark Gray. PA made aware.

## 2022-09-07 LAB — BETA-2-GLYCOPROTEIN I ABS, IGG/M/A
Beta-2 Glyco I IgG: <9 GPI IgG units (ref 0–20)
Beta-2-Glycoprotein I IgA: <9 GPI IgA units (ref 0–25)
Beta-2-Glycoprotein I IgM: <9 GPI IgM units (ref 0–32)

## 2022-09-08 LAB — CARDIOLIPIN ANTIBODIES, IGG, IGM, IGA
Anticardiolipin IgA: <9 APL U/mL (ref 0–11)
Anticardiolipin IgG: <9 GPL U/mL (ref 0–14)
Anticardiolipin IgM: <9 MPL U/mL (ref 0–12)

## 2022-09-08 LAB — LUPUS ANTICOAGULANT PANEL
DRVVT: 50.8 s — ABNORMAL HIGH (ref 0.0–47.0)
PTT Lupus Anticoagulant: 35.7 s (ref 0.0–43.5)

## 2022-09-08 LAB — PROTEIN S ACTIVITY: Protein S Activity: 93 % (ref 63–140)

## 2022-09-08 LAB — PROTEIN C ACTIVITY: Protein C Activity: 123 % (ref 73–180)

## 2022-09-08 LAB — DRVVT MIX: dRVVT Mix: 44.2 s — ABNORMAL HIGH (ref 0.0–40.4)

## 2022-09-08 LAB — DRVVT CONFIRM: dRVVT Confirm: 1 ratio (ref 0.8–1.2)

## 2022-09-09 LAB — GLUCOSE, CAPILLARY: Glucose-Capillary: 144 mg/dL — ABNORMAL HIGH (ref 70–99)

## 2022-09-12 LAB — FACTOR 5 LEIDEN

## 2022-09-12 LAB — HEXAGONAL PHOSPHOLIPID NEUTRALIZATION: Hexagonal Phospholipid Neutral: 11 s

## 2022-09-16 LAB — PROTHROMBIN GENE MUTATION

## 2022-09-19 ENCOUNTER — Inpatient Hospital Stay: Payer: 59 | Attending: Hematology and Oncology | Admitting: Hematology and Oncology

## 2022-09-19 ENCOUNTER — Encounter: Payer: Self-pay | Admitting: Hematology and Oncology

## 2022-09-19 DIAGNOSIS — I82432 Acute embolism and thrombosis of left popliteal vein: Secondary | ICD-10-CM

## 2022-09-19 NOTE — Progress Notes (Signed)
Mark Gray NOTE  Patient Care Team: Mark Conners, MD as PCP - General (Family Medicine)  CHIEF COMPLAINTS/PURPOSE OF CONSULTATION:  Hypercoagulable work up.  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  He is currently on anticoagulation with Eliquis, tolerating it very well.  No clear provoking factors. He is here for telephone visit, to review hypercoagulable work up results.  He is doing quite well.  We have discussed that hypercoagulable workup is negative today and the recommended duration of anticoagulation still remains about 3 to 6 months.  He will also follow-up with the DVT clinic as scheduled. At this time he does not have to follow-up with hematology on a routine basis.  He understands the need for indefinite anticoagulation in case of a second episode of DVT/PE.  All his questions were answered to the best my knowledge.  HISTORY OF PRESENTING ILLNESS:  Mark Gray 52 y.o. male is here because of hypercoagulable work up.  This is a 52 year old male patient with no significant past medical history most recently diagnosed with left lower extremity DVT in the popliteal vein.  Many weeks prior to the presentation he traveled to [place] in the car for about 8 hours.  About 3 weeks after this trip he noticed that his left lower extremity which is already swollen at baseline has become even more swollen.  He had imaging which showed superficial vein thrombosis.  He was not started on any anticoagulation for SVT.  He then went to see his PCP who recommended anticoagulation for short while and given the SVT and extremity swelling.  He was also referred to vascular surgery and he had a repeat imaging which showed thrombus in the popliteal vein, gastrocnemius and great saphenous vein.   The great saphenous vein thrombus appears to extend from the mid calf to mid distal thigh.  He is here for telephone visit to review hypercoagulable workup results.  No new concerns.   MEDICAL HISTORY:  Past Medical History:  Diagnosis Date   Allergy    Asthma    DVT (deep venous thrombosis) (HCC)    LLE 07/2022 per patient   History of chickenpox     SURGICAL HISTORY: No past surgical history on file.  SOCIAL HISTORY: Social History   Socioeconomic History   Marital status: Married    Spouse name: Not on file   Number of children: Not on file   Years of education: Not on file   Highest education level: Not on file  Occupational History   Not on file  Tobacco Use   Smoking status: Never   Smokeless tobacco: Never  Vaping Use   Vaping Use: Never used  Substance and Sexual Activity   Alcohol use: Yes    Comment: 1-2 drinks per week occasionally   Drug use: Never   Sexual activity: Yes  Other Topics Concern   Not on file  Social History Narrative   Not on file   Social Determinants of Health   Financial Resource Strain: Not on file  Food Insecurity: Not on file  Transportation Needs: Not on file  Physical Activity: Not on file  Stress: Not on file  Social Connections: Not on file  Intimate Partner Violence: Not on file    FAMILY HISTORY: Family History  Problem Relation Age of Onset  Cancer Mother    Heart attack Mother    CVA Father    Depression Son     ALLERGIES:  is allergic to shellfish allergy.  MEDICATIONS:  Current Outpatient Medications  Medication Sig Dispense Refill   albuterol (VENTOLIN HFA) 108 (90 Base) MCG/ACT inhaler Inhale into the lungs every 6 (six) hours as needed for wheezing or shortness of breath.     apixaban (ELIQUIS) 5 MG TABS tablet Start with 10 mg twice a day for 7 days, then reduce to 5 mg twice a day. 60 tablet 1   aspirin EC 81 MG tablet Take 81 mg by mouth daily. Swallow whole.     Cetirizine HCl (ZYRTEC PO) Take  by mouth as needed.     Multiple Vitamins-Minerals (MULTIVITAMIN ADULTS PO) Take by mouth daily.     Current Facility-Administered Medications  Medication Dose Route Frequency Provider Last Rate Last Admin   triamcinolone acetonide (KENALOG) 10 MG/ML injection 10 mg  10 mg Other Once Trula Slade, DPM         PHYSICAL EXAMINATION: ECOG PERFORMANCE STATUS: 0 - Asymptomatic  There were no vitals filed for this visit.  There were no vitals filed for this visit.   Physical exam deferred, telephone visit LABORATORY DATA:  I have reviewed the data as listed No results found for: "WBC", "HGB", "HCT", "MCV", "PLT"   Chemistry   No results found for: "NA", "K", "CL", "CO2", "BUN", "CREATININE", "GLU" No results found for: "CALCIUM", "ALKPHOS", "AST", "ALT", "BILITOT"     RADIOGRAPHIC STUDIES: I have personally reviewed the radiological images as listed and agreed with the findings in the report. VAS [place] LOWER EXTREMITY VENOUS REFLUX  Result Date: 08/30/2022  Lower Venous Reflux Study Patient Name:  Mark Gray  Date of Exam:   08/30/2022 Medical Rec #: [id number]          Accession #:    [id number] Date of Birth: 08-Aug-1971          Patient Gender: M Patient Age:   17 years Exam Location:  [facility] Procedure:      VAS [place] LOWER EXTREMITY VENOUS REFLUX Referring Phys: JOSHUA ROBINS --------------------------------------------------------------------------------  Other Indications: Patient presents for evaluation of GSV thrombus and venous                    insufficiency of the left leg. He was found to have thrombus                    in the left GSV that extends from the mid calf to the distal                    thigh on 08/08/22. He was not started on Eliquis until a few                    days ago after he saw a different doctor who recommended                    anticoagulation and referred him to [facility]. Anticoagulation: Eliquis. Comparison Study: Previous left lower  extremity venous duplex performed at [facility] on 08/08/22 showed acute superficial vein                   thrombosis of the left great saphenous vein extending from  the                   mid calf to the distal thigh. Performing Technologist: Mariane Masters RVT  Examination Guidelines: A complete evaluation includes B-mode imaging, spectral Doppler, color Doppler, and power Doppler as needed of all accessible portions of each vessel. Bilateral testing is considered an integral part of a complete examination. Limited examinations for reoccurring indications may be performed as noted. The reflux portion of the exam is performed with the patient in reverse Trendelenburg. Significant venous reflux is defined as >500 ms in the superficial venous system, and >1 second in the deep venous system.  +--------------+---------+------+-----------+------------+--------------+ LEFT          Reflux NoRefluxReflux TimeDiameter cmsComments                               Yes                                        +--------------+---------+------+-----------+------------+--------------+ CFV           no                                                   +--------------+---------+------+-----------+------------+--------------+ FV prox       no                                                   +--------------+---------+------+-----------+------------+--------------+ FV mid        no                                                   +--------------+---------+------+-----------+------------+--------------+ FV dist       no                                                   +--------------+---------+------+-----------+------------+--------------+ Popliteal     no                                    acute thrombus +--------------+---------+------+-----------+------------+--------------+ GSV at [facility]    no                            0.64                    +--------------+---------+------+-----------+------------+--------------+ GSV prox thighno                            0.48                   +--------------+---------+------+-----------+------------+--------------+ GSV mid thigh  acute thrombus +--------------+---------+------+-----------+------------+--------------+ GSV dist thigh                                      acute thrombus +--------------+---------+------+-----------+------------+--------------+ GSV at knee                                         acute thrombus +--------------+---------+------+-----------+------------+--------------+ GSV prox calf                                       acute thrombus +--------------+---------+------+-----------+------------+--------------+ GSV mid calf                                        acute thrombus +--------------+---------+------+-----------+------------+--------------+ SSV prox calf no                                                   +--------------+---------+------+-----------+------------+--------------+  Findings reported to Dr. Myra Gianotti and Thereasa Parkin at 12:15 pm.  Summary: Left: - Color duplex evaluation of the left lower extremity shows there is thrombus in the popliteal vein, the gastrocnemius veins and the great saphenous vein. The great saphenous vein thrombus appears to extend from the mid calf to the mid/distal thigh.  Recommendations: VASCULAR CONSULT IS INDICATED. Patient is seeing Dr. Linnell Fulling 08/30/22 at 2:20 pm.  *See table(s) above for measurements and observations. Electronically signed by Sherald Hess MD on 08/30/2022 at 2:07:08 PM.    Final     All questions were answered. The patient knows to call the clinic with any problems, questions or concerns.  I connected with  Eusebio Me on 09/19/22 by a telephone application and verified that I am speaking with the correct person using two  identifiers.   I discussed the limitations of evaluation and management by telemedicine. The patient expressed understanding and agreed to proceed.  Total time: 12 min    Rachel Moulds, MD 09/19/2022 11:49 AM

## 2022-09-20 ENCOUNTER — Ambulatory Visit (HOSPITAL_COMMUNITY)
Admission: RE | Admit: 2022-09-20 | Discharge: 2022-09-20 | Disposition: A | Discharge status: Home / Self Care | Payer: 59 | Source: Ambulatory Visit | Attending: Vascular Surgery | Admitting: Vascular Surgery

## 2022-09-20 ENCOUNTER — Other Ambulatory Visit (HOSPITAL_COMMUNITY): Payer: Self-pay

## 2022-09-20 VITALS — BP 141/100 | HR 74

## 2022-09-20 DIAGNOSIS — I82432 Acute embolism and thrombosis of left popliteal vein: Secondary | ICD-10-CM | POA: Insufficient documentation

## 2022-09-20 MED ORDER — APIXABAN 5 MG PO TABS: 5.0000 mg | ORAL_TABLET | Freq: Two times a day (BID) | ORAL | 3 refills | Status: DC

## 2022-09-20 NOTE — Patient Instructions (Signed)
-  Continue Eliquis 5 mg (1 tablet) twice daily.  -[facility] [place]. You will likely need to call the pharmacy to ask them to fill this when you start to run low on your current supply.  -It is important to take your medication around the same time every day.  -Avoid NSAIDs like ibuprofen (Advil, Motrin) and naproxen (Aleve) as well as aspirin doses over 100 mg daily. -Tylenol (acetaminophen) is the preferred over the counter pain medication to lower the risk of bleeding. -Be sure to alert all of your health care providers that you are taking an anticoagulant prior to starting a new medication or having a procedure. -Monitor for signs and symptoms of bleeding (abnormal bruising, prolonged bleeding, nose bleeds, bleeding from gums, discolored urine, black tarry stools). If you have fallen and hit your head OR if your bleeding is severe or not stopping, seek emergency care.  -Go to the emergency room if emergent signs and symptoms of new clot occur (new or worse swelling and pain in an arm or leg, shortness of breath, chest pain, fast or irregular heartbeats, lightheadedness, dizziness, fainting, coughing up blood) or if you experience a significant color change (pale or blue) in the extremity that has the DVT.  -We recommend you wear thigh high compression stockings (20-30 mmHg) as long as you are having swelling or pain. Be sure to purchase the correct size and take them off at night.   Your next visit is on Tuesday April 9th at [facility] [place], [place], [place] [address] Enter the hospital through Entrance C off [facility] and pull up to the [place] entrance to the free [place] parking.  Check in for your appointment at the [place] [facility].   If you have any questions or need to reschedule an appointment, please call [facility] (Pharmacist).  If you are having an emergency, call 911 or  present to the nearest emergency room.   What is a DVT?  -Deep vein thrombosis (DVT) is a condition in which a blood clot forms in a vein of the deep venous system which can occur in the lower leg, thigh, pelvis, arm, or neck. This condition is serious and can be life-threatening if the clot travels to the arteries of the lungs and causing a blockage (pulmonary embolism, PE). A DVT can also damage veins in the leg, which can lead to long-term venous disease, leg pain, swelling, discoloration, and ulcers or sores (post-thrombotic syndrome).  -Treatment may include taking an anticoagulant medication to prevent more clots from forming and the current clot from growing, wearing compression stockings, and/or surgical procedures to remove or dissolve the clot.

## 2022-09-20 NOTE — Progress Notes (Signed)
DVT Clinic Note  Name: Mark Gray     MRN: [id number]     DOB: [birth year]     Sex: male  PCP: Farrel Conners, MD  Today's Visit: Visit Information: Follow Up Visit  Referred to CPP by: Dr. Trula Slade Reason for referral:  Chief Complaint  Patient presents with   Med Management - DVT   HISTORY OF PRESENT ILLNESS:  Mark Gray is a 52 y.o. male who presents after diagnosis of DVT for medication management. Patient has 10 year history of chronic left leg swelling. He started to have worsening swelling in the L leg with a palpable cord, and on 08/08/22 he was diagnosed with a superficial vein thrombosis in the L GSV. He was initially started on aspirin by a provider at his workplace for this. He established with a [facility] PCP 08/26/22 who started Eliquis x45 days for SVT. The patient was then seen at [facility] by Dr. Trula Slade on 08/30/22 and found to have DVT involving L popliteal and gastrocnemius veins. He was referred to hematology given the unprovoked nature of his clot and to me for anticoagulation management. Patient was seen by hematology 09/06/22 for hypercoagulable work up, which was negative. They recommended 3-6 months anticoagulation for 1st unprovoked DVT followed by low dose aspirin and indefinite anticoagulation if DVT were to recur.    Today, patient reports he is still having some swelling in L leg. Denies abnormal bleeding or bruising. Denies missed doses of Eliquis. Ambulating well independently and is not wearing compression stockings. He just refilled his Eliquis yesterday and needs refills for future months. The patient often travels internationally for work and has never had a DVT before. He has an upcoming flight to [place] in March and is wondering if this will be okay.   Positive Thrombotic Risk Factors: Obesity, None Present Bleeding Risk Factors: Anticoagulant therapy, Antiplatelet therapy  Negative Thrombotic Risk Factors: Previous VTE, Recent surgery  (within 3 months), Recent trauma (within 3 months), Recent admission to hospital with acute illness (within 3 months), Paralysis, paresis, or recent plaster cast immobilization of lower extremity, Central venous catheterization, Sedentary journey lasting >8 hours within 4 weeks, Pregnancy, Testosterone therapy, Estrogen therapy, Active cancer, Recent COVID diagnosis (within 3 months), Known thrombophilic condition, Older age, Smoking, Recent cesarean section (within 3 months), Bed rest >72 hours within 3 months, Erythropoiesis-stimulating agent, Within 6 weeks postpartum, Non-malignant, chronic inflammatory condition  Rx Insurance Coverage: Commercial Rx Affordability: Eliquis is $50 for a 30 day supply. Provided him with co-pay card today to bring cost down to $10/month.  Preferred Pharmacy: [facility] [place]   Past Medical History:  Diagnosis Date   Allergy    Asthma    DVT (deep venous thrombosis) ([place])    LLE 07/2022 per patient   History of chickenpox     No past surgical history on file.  Social History   Socioeconomic History   Marital status: Married    Spouse name: Not on file   Number of children: Not on file   Years of education: Not on file   Highest education level: Not on file  Occupational History   Not on file  Tobacco Use   Smoking status: Never   Smokeless tobacco: Never  Vaping Use   Vaping Use: Never used  Substance and Sexual Activity   Alcohol use: Yes    Comment: 1-2 drinks per week occasionally   Drug use: Never   Sexual  activity: Yes  Other Topics Concern   Not on file  Social History Narrative   Not on file   Social Determinants of Health   Financial Resource Strain: Not on file  Food Insecurity: Not on file  Transportation Needs: Not on file  Physical Activity: Not on file  Stress: Not on file  Social Connections: Not on file  Intimate Partner Violence: Not on file    Family History  Problem Relation Age of Onset    Cancer Mother    Heart attack Mother    CVA Father    Depression Son     Allergies as of 09/20/2022 - Review Complete 09/20/2022  Allergen Reaction Noted   Shellfish allergy  08/26/2022    Current Outpatient Medications on File Prior to Encounter  Medication Sig Dispense Refill   aspirin EC 81 MG tablet Take 81 mg by mouth daily. Swallow whole.     Cetirizine HCl (ZYRTEC PO) Take by mouth as needed.     Multiple Vitamins-Minerals (MULTIVITAMIN ADULTS PO) Take by mouth daily.     albuterol (VENTOLIN HFA) 108 (90 Base) MCG/ACT inhaler Inhale into the lungs every 6 (six) hours as needed for wheezing or shortness of breath.     Current Facility-Administered Medications on File Prior to Encounter  Medication Dose Route Frequency Provider Last Rate Last Admin   triamcinolone acetonide (KENALOG) 10 MG/ML injection 10 mg  10 mg Other Once Trula Slade, DPM       REVIEW OF SYSTEMS:  Review of Systems  Respiratory:  Negative for shortness of breath.   Cardiovascular:  Positive for leg swelling. Negative for chest pain and palpitations.  Musculoskeletal:  Negative for joint pain and myalgias.  Neurological:  Negative for dizziness and tingling.   PHYSICAL EXAMINATION:  Vitals:   09/20/22 1009  BP: (!) 141/100  Pulse: 74  SpO2: 100%   Physical Exam Vitals reviewed.  Cardiovascular:     Rate and Rhythm: Normal rate.  Pulmonary:     Effort: Pulmonary effort is normal.  Musculoskeletal:     Right lower leg: Edema (R worse than left; 1+ pitting edema) present.     Left lower leg: Edema present.  Skin:    Comments: Hyperpigmentation of R medial ankle   Villalta Score for Post-Thrombotic Syndrome: Pain: Mild Cramps: Absent Heaviness: Mild Paresthesia: Absent Pruritus: Absent Pretibial Edema: Mild Skin Induration: Absent Hyperpigmentation: Severe (R medial ankle. Pt reports long history of this preceding DVT.) Redness: Absent Venous Ectasia: Absent Pain on calf  compression: Absent Villalta Preliminary Score: 6 Is venous ulcer present?: No If venous ulcer is present and score is <15, then 15 points total are assigned: Absent Villalta Total Score: 6  LABS:  CBC  No results found for: "WBC", "RBC", "HGB", "HCT", "PLT", "MCV", "MCH", "MCHC", "RDW", "LYMPHSABS", "MONOABS", "EOSABS", "BASOSABS"  Hepatic Function   No results found for: "PROT", "ALBUMIN", "AST", "ALT", "ALKPHOS", "BILITOT", "BILIDIR", "IBILI"  Renal Function   No results found for: "CREATININE"  CrCl cannot be calculated (No successful lab value found.).   Labs are up to date - see scanned lab report from 03/21/22.   [facility] Lab Studies:  08/08/22 VAS [place] LOWER EXTREMITY VENOUS LEFT (DVT)  Summary:  RIGHT:  - No evidence of common femoral vein obstruction.    LEFT:  - Findings consistent with acute superficial vein thrombosis involving the  left great saphenous vein.  - There is no evidence of deep vein thrombosis in the lower extremity.  08/30/22 VAS [place] LOWER EXTREMITY VENOUS REFLUX LEFT Summary:  Left:  - Color duplex evaluation of the left lower extremity shows there is  thrombus in the popliteal vein, the gastrocnemius veins and the great  saphenous vein. The great saphenous vein thrombus appears to extend from  the mid calf to the mid/distal thigh.   ASSESSMENT: Location of DVT: Left popliteal vein, Left distal vein, Left superficial vein Cause of DVT: unprovoked, no hypercoagulable state per hematology work up  PLAN: -Continue apixaban (Eliquis) 5 mg twice daily. -Expected duration of therapy: Hematology recommends treatment for 3-6 months for 1st unprovoked DVT followed by aspirin. If a second DVT/PE were to occur, indefinite treatment would be indicated. Therapy started on 08/26/22. -Patient educated on purpose, proper use and potential adverse effects of apixaban (Eliquis). -Discussed importance of taking medication around the same time every day. -Advised  patient of medications to avoid (NSAIDs, aspirin doses >100 mg daily). -Educated that Tylenol (acetaminophen) is the preferred analgesic to lower the risk of bleeding. -Advised patient to alert all providers of anticoagulation therapy prior to starting a new medication or having a procedure. -Emphasized importance of monitoring for signs and symptoms of bleeding (abnormal bruising, prolonged bleeding, nose bleeds, bleeding from gums, discolored urine, black tarry stools). -Educated patient to present to the ED if emergent signs and symptoms of new thrombosis occur. -Counseled patient to wear compression stockings daily, removing at night. -Regarding upcoming flight to [place] in March, recommended patient wear compression stockings and ambulate often while on the plane, remembering to pack anticoagulation with him.  -Refills and co-pay card provided for the patient today.   Follow up: 2 months in DVT Clinic which will be after 3 months of anticoagulation.  Rebbeca Paul, PharmD, [place], CPP [facility] Clinical Pharmacist Practitioner Office: [phone]

## 2022-11-04 ENCOUNTER — Encounter: Payer: Self-pay | Admitting: Family Medicine

## 2022-11-04 ENCOUNTER — Ambulatory Visit: Payer: 59 | Admitting: Family Medicine

## 2022-11-04 VITALS — BP 118/80 | HR 57 | Temp 98.3°F | Ht 69.0 in | Wt 302.9 lb

## 2022-11-04 DIAGNOSIS — I82812 Embolism and thrombosis of superficial veins of left lower extremities: Secondary | ICD-10-CM | POA: Diagnosis not present

## 2022-11-04 NOTE — Progress Notes (Signed)
Established Patient Office Visit  Subjective   Patient ID: Mark Gray, male    DOB: 28-Dec-1970  Age: 52 y.o. MRN: [id number]  Chief Complaint  Patient presents with   Medical Management of Chronic Issues    Pt is here for follow up.  States that he saw the vascular specialists, states that they repeated the [place] and a DVT was found. States that he did start the Eliquis after the visit with me so by the time he went to see the vascular specialist he was already on the blood thinner. States that his coagulation studies were WNL and he was told he would need to continue the eliquis for 6-8 months then they would  re-evaluate.   Pt reports that otherwise he is doing well, no SOB or chest pain, no dizziness or other issues.    Current Outpatient Medications  Medication Instructions   albuterol (VENTOLIN HFA) 108 (90 Base) MCG/ACT inhaler Inhalation, Every 6 hours PRN   apixaban (ELIQUIS) 5 mg, Oral, 2 times daily   aspirin EC 81 mg, Oral, Daily, Swallow whole.   Cetirizine HCl (ZYRTEC PO) Oral, As needed   Multiple Vitamins-Minerals (MULTIVITAMIN ADULTS PO) Oral, Daily    Patient Active Problem List   Diagnosis Date Noted   Acute deep vein thrombosis (DVT) of popliteal vein of left lower extremity ([place]) 09/20/2022   Acute superficial venous thrombosis of lower extremity, left 08/26/2022   Venous insufficiency of both lower extremities 08/26/2022   Plantar fasciitis, right 01/23/2018      Review of Systems  Constitutional:  Negative for chills and fever.  All other systems reviewed and are negative.     Objective:     BP 118/80 (BP Location: Right Arm, Patient Position: Sitting, Cuff Size: Large)   Pulse (!) 57   Temp 98.3 F (36.8 C) (Oral)   Ht 5\' 9"  (1.753 m)   Wt (!) 302 lb 14.4 oz (137.4 kg)   SpO2 97%   BMI 44.73 kg/m    Physical Exam Constitutional:      Appearance: Normal appearance. He is morbidly obese.  Cardiovascular:     Rate and Rhythm:  Normal rate and regular rhythm.     Heart sounds: Normal heart sounds.  Pulmonary:     Effort: Pulmonary effort is normal.     Breath sounds: Normal breath sounds.  Musculoskeletal:     Comments: Left leg is still edematous, however the veins on the medial side of the leg are much softer, non tender, there is reduced edema, the chronic lymphedema changes of the left ankle remain stable  Neurological:     General: No focal deficit present.     Mental Status: He is alert and oriented to person, place, and time.      No results found for any visits on 11/04/22.    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 Will see him back at the end of the year for follow up.        Return in about 1 year (around 11/04/2023) for check up.Marland Kitchen  Farrel Conners, MD

## 2022-11-04 NOTE — Assessment & Plan Note (Signed)
With also acute DVT, continuing eliquis 5 mg BID. Will see him back at the end of the year for follow up.

## 2022-11-26 ENCOUNTER — Encounter (HOSPITAL_COMMUNITY): Payer: Self-pay

## 2022-11-26 ENCOUNTER — Ambulatory Visit (HOSPITAL_COMMUNITY)
Admission: RE | Admit: 2022-11-26 | Discharge: 2022-11-26 | Disposition: A | Discharge status: Home / Self Care | Payer: 59 | Source: Ambulatory Visit | Attending: Vascular Surgery | Admitting: Vascular Surgery

## 2022-11-26 VITALS — BP 128/103 | HR 70

## 2022-11-26 DIAGNOSIS — I82432 Acute embolism and thrombosis of left popliteal vein: Secondary | ICD-10-CM | POA: Diagnosis present

## 2022-11-26 NOTE — Progress Notes (Addendum)
DVT Clinic Note  Name: Mark Gray     MRN: [id number]     DOB: [birth year]     Sex: male  PCP: [name], MD  Today's Visit: Visit Information: Discharge Visit  Referred to CPP by: Dr. Myra Gianotti Reason for referral:  Chief Complaint  Patient presents with   Med Management - DVT   HISTORY OF PRESENT ILLNESS:  Mark Gray is a 52 y.o. male who presents after diagnosis of DVT for medication management. Patient has 10 year history of chronic left leg swelling. He started to have worsening swelling in the L leg with a palpable cord, and on 08/08/22 he was diagnosed with a superficial vein thrombosis in the L GSV. He was initially started on aspirin by a provider at his workplace for this. He established with a [place] PCP 08/26/22 who started Eliquis x45 days for SVT. The patient was then seen at [facility] by Dr. Myra Gianotti on 08/30/22 and found to have DVT involving L popliteal and gastrocnemius veins. He was referred to hematology given the unprovoked nature of his clot and to me for anticoagulation management. Patient was seen by hematology 09/06/22 for hypercoagulable work up, which was negative. They recommended 3-6 months anticoagulation for 1st unprovoked DVT followed by low dose aspirin and indefinite anticoagulation if DVT were to recur. Last seen by me in February. He was tolerating Eliquis well and I provided him with a copay card to reduce the cost of refills.   Today patient reports his symptoms have resolved. He recently traveled internationally and had no issues. Denies abnormal bleeding or bruising. Denies missed doses of Eliquis. Has not needed to wear compression stockings. Thanks me for the copay card provided at last visit as he has had no problems using it and saved $40/month. He reports that he continues to work on losing weight and has now lost 64 lbs overall.   Positive Thrombotic Risk Factors: Obesity Bleeding Risk Factors: Anticoagulant  therapy  Negative Thrombotic Risk Factors: Previous VTE, Recent surgery (within 3 months), Recent trauma (within 3 months), Recent admission to hospital with acute illness (within 3 months), Paralysis, paresis, or recent plaster cast immobilization of lower extremity, Bed rest >72 hours within 3 months, Sedentary journey lasting >8 hours within 4 weeks, Central venous catheterization, Pregnancy, Testosterone therapy, Estrogen therapy, Active cancer, Smoking, Older age, Known thrombophilic condition, Recent COVID diagnosis (within 3 months), Recent cesarean section (within 3 months), Within 6 weeks postpartum, Erythropoiesis-stimulating agent, Non-malignant, chronic inflammatory condition  Rx Insurance Coverage: Commercial Rx Affordability: Eliquis is $50 for a 30 day supply. Provided him with co-pay card previously to bring cost down to $10/month, which he has been using without issue.  Preferred Pharmacy: [facility] [place]   Past Medical History:  Diagnosis Date   Allergy    Asthma    DVT (deep venous thrombosis)    LLE 07/2022 per patient   History of chickenpox     No past surgical history on file.  Social History   Socioeconomic History   Marital status: Married    Spouse name: Not on file   Number of children: Not on file   Years of education: Not on file   Highest education level: Not on file  Occupational History   Not on file  Tobacco Use   Smoking status: Never   Smokeless tobacco: Never  Vaping Use   Vaping Use: Never used  Substance and Sexual Activity  Alcohol use: Yes    Comment: 1-2 drinks per week occasionally   Drug use: Never   Sexual activity: Yes  Other Topics Concern   Not on file  Social History Narrative   Not on file   Social Determinants of Health   Financial Resource Strain: Not on file  Food Insecurity: Not on file  Transportation Needs: Not on file  Physical Activity: Not on file  Stress: Not on file  Social  Connections: Not on file  Intimate Partner Violence: Not on file    Family History  Problem Relation Age of Onset   Cancer Mother    Heart attack Mother    CVA Father    Depression Son     Allergies as of 11/26/2022 - Review Complete 11/26/2022  Allergen Reaction Noted   Shellfish allergy  08/26/2022    Current Outpatient Medications on File Prior to Encounter  Medication Sig Dispense Refill   apixaban (ELIQUIS) 5 MG TABS tablet Take 1 tablet (5 mg total) by mouth 2 (two) times daily. 60 tablet 3   aspirin EC 81 MG tablet Take 81 mg by mouth daily. Swallow whole.     Cetirizine HCl (ZYRTEC PO) Take by mouth as needed.     Multiple Vitamins-Minerals (MULTIVITAMIN ADULTS PO) Take by mouth daily.     albuterol (VENTOLIN HFA) 108 (90 Base) MCG/ACT inhaler Inhale into the lungs every 6 (six) hours as needed for wheezing or shortness of breath. (Patient not taking: Reported on 11/26/2022)     Current Facility-Administered Medications on File Prior to Encounter  Medication Dose Route Frequency Provider Last Rate Last Admin   triamcinolone acetonide (KENALOG) 10 MG/ML injection 10 mg  10 mg Other Once Vivi Barrack, DPM       REVIEW OF SYSTEMS:  Review of Systems  Respiratory:  Negative for shortness of breath.   Cardiovascular:  Negative for chest pain, palpitations and leg swelling.  Musculoskeletal:  Negative for myalgias.  Neurological:  Negative for dizziness and tingling.   PHYSICAL EXAMINATION:  Vitals:   11/26/22 1402  BP: (!) 128/103  Pulse: 70  SpO2: 100%    Physical Exam Vitals reviewed.  Cardiovascular:     Rate and Rhythm: Normal rate.  Pulmonary:     Effort: Pulmonary effort is normal.  Musculoskeletal:        General: No tenderness.     Left lower leg: Edema (mild nonpitting edema) present.  Skin:    Comments: Hyperpigmentation of L medial ankle  Psychiatric:        Mood and Affect: Mood normal.        Behavior: Behavior normal.        Thought Content:  Thought content normal.   Villalta Score for Post-Thrombotic Syndrome: Pain: Absent Cramps: Absent Heaviness: Absent Paresthesia: Absent Pruritus: Absent Pretibial Edema: Mild Skin Induration: Absent Hyperpigmentation: Severe (R medial ankle. Pt reports long history of this preceding DVT.) Redness: Absent Venous Ectasia: Absent Pain on calf compression: Absent Villalta Preliminary Score: 4 Is venous ulcer present?: No If venous ulcer is present and score is <15, then 15 points total are assigned: Absent Villalta Total Score: 4  LABS:  CBC  No results found for: "WBC", "RBC", "HGB", "HCT", "PLT", "MCV", "MCH", "MCHC", "RDW", "LYMPHSABS", "MONOABS", "EOSABS", "BASOSABS"  Hepatic Function   No results found for: "PROT", "ALBUMIN", "AST", "ALT", "ALKPHOS", "BILITOT", "BILIDIR", "IBILI"  Renal Function   No results found for: "CREATININE"  CrCl cannot be calculated (No successful lab value  found.).   Labs are up to date - see scanned lab report from 03/21/22.   [facility] V[facility] Lab Studies:  08/08/22 VAS US LOWER EXTREMITY VENOUS LEFT (DVT)  Summary:  RIGHT:  - No evidence of common femoral vein obstruction.   LEFT:  - Findings consistent with acute superficial vein thrombosis involving the  left great saphenous vein.  - There is no evidence of deep vein thrombosis in the lower extremity.    08/30/22 VAS US LOWER EXTREMITY VENOUS REFLUX LEFT Summary:  Left:  - Color duplex evaluation of the left lower extremity shows there is  thrombus in the popliteal vein, the gastrocnemius veins and the great  saphenous vein. The great saphenous vein thrombus appears to extend from  the mid calf to the mid/distal thigh.   ASSESSMENT: Location of DVT: Left popliteal vein, Left distal vein, Left superficial vein Cause of DVT: unprovoked - hypercoagulable workup by hematology was negative. They recommended 3-6 months of anticoagulation for 1st unprovoked DVT followed by low dose aspirin and  indefinite anticoagulation if DVT were to recur. Hematology has no further follow up planned with him. Patient is working on reducing his modifiable risk factors for DVT such as obesity as he continues to lose weight. He is down 64 lbs and increasing his physical activity. He is asymptomatic today. We will continue Eliquis for a total of 6 months per hematology recommendations and follow up with the patient as needed. He is already on a low dose aspirin which he will continue when Eliquis course is completed.   PLAN: -Patient is discharged from the DVT Clinic. -Continue anticoagulation with Eliquis 5 mg BID for 6 months total (start date 08/26/22).  -Patient has been given refills to last 6 months.  -Patient educated on purpose, proper use and potential adverse effects of apixaban (Eliquis). -Discussed importance of taking medication around the same time every day. -Advised patient of medications to avoid (NSAIDs, aspirin doses >100 mg daily). -Educated that Tylenol (acetaminophen) is the preferred analgesic to lower the risk of bleeding. -Advised patient to alert all providers of anticoagulation therapy prior to starting a new medication or having a procedure. -Emphasized importance of monitoring for signs and symptoms of bleeding (abnormal bruising, prolonged bleeding, nose bleeds, bleeding from gums, discolored urine, black tarry stools). -Educated patient to present to the ED if emergent signs and symptoms of new thrombosis occur.  Follow up: in DVT Clinic as needed. Otherwise follow up with PCP.   Pervis HockingMadison Yates, PharmD, AltoBCACP, CPP [facility] Clinical Pharmacist Practitioner Office: [phone]  I have evaluated the patient's chart/imaging and refer this patient to the Clinical Pharmacist Practitioner for medication management. I have reviewed the CPP's documentation and agree with her assessment and plan. I was immediately available during the visit for questions and  collaboration.   Waverly Ferrarihristopher Jeorgia Helming, MD

## 2022-11-26 NOTE — Patient Instructions (Addendum)
You have been discharged from the DVT Clinic! No further follow up in the DVT Clinic is needed.  -Start date of Eliquis: 08/26/22 >> take for 6 months (stop mid July) -Continue Eliquis 5 mg (1 tablet) twice daily for a total of 6 months, then continue aspirin 81 mg daily.  -Call if you need any refills to complete your 6 months of refills.  -It is important to take your medication around the same time every day.  -Avoid NSAIDs like ibuprofen (Advil, Motrin) and naproxen (Aleve) as well as aspirin doses over 100 mg daily. -Tylenol (acetaminophen) is the preferred over the counter pain medication to lower the risk of bleeding. -Be sure to alert all of your health care providers that you are taking an anticoagulant prior to starting a new medication or having a procedure. -Monitor for signs and symptoms of bleeding (abnormal bruising, prolonged bleeding, nose bleeds, bleeding from gums, discolored urine, black tarry stools). If you have fallen and hit your head OR if your bleeding is severe or not stopping, seek emergency care.  -Go to the emergency room if emergent signs and symptoms of new clot occur (new or worse swelling and pain in an arm or leg, shortness of breath, chest pain, fast or irregular heartbeats, lightheadedness, dizziness, fainting, coughing up blood) or if you experience a significant color change (pale or blue) in the extremity that has the DVT.  -If you are having an emergency, call 911 or present to the nearest emergency room.   Please reach out if any questions come up. [phone] [facility].

## 2023-01-27 ENCOUNTER — Other Ambulatory Visit (HOSPITAL_COMMUNITY): Payer: Self-pay | Admitting: Student-PharmD

## 2023-01-27 DIAGNOSIS — I82432 Acute embolism and thrombosis of left popliteal vein: Secondary | ICD-10-CM

## 2023-01-27 MED ORDER — APIXABAN 5 MG PO TABS: 5.0000 mg | ORAL_TABLET | Freq: Two times a day (BID) | ORAL | 0 refills | Status: DC

## 2023-03-03 ENCOUNTER — Other Ambulatory Visit: Payer: Self-pay

## 2023-03-03 ENCOUNTER — Telehealth: Payer: Self-pay

## 2023-03-03 DIAGNOSIS — M7989 Other specified soft tissue disorders: Secondary | ICD-10-CM

## 2023-03-03 NOTE — Telephone Encounter (Signed)
Pt called to schedule an appt for his chronic BLE swelling/varicose veins. He stated he had seen MD in past for his DVT and was told to f/u for leg swelling after he completed his 6 months of AC. Pt has now finished his 6 month course and is requesting f/u for BLE that has been ongoing for years. Dr. Myra Gianotti advised to have him see the PA with a recent reflux study.

## 2023-04-04 ENCOUNTER — Ambulatory Visit: Payer: 59

## 2023-04-04 ENCOUNTER — Encounter (HOSPITAL_COMMUNITY): Payer: 59

## 2023-04-25 ENCOUNTER — Ambulatory Visit: Payer: 59 | Admitting: Physician Assistant

## 2023-04-25 ENCOUNTER — Ambulatory Visit (HOSPITAL_COMMUNITY)
Admission: RE | Admit: 2023-04-25 | Discharge: 2023-04-25 | Disposition: A | Discharge status: Home / Self Care | Payer: 59 | Source: Ambulatory Visit | Attending: Surgery | Admitting: Surgery

## 2023-04-25 VITALS — BP 144/99 | HR 62 | Temp 98.0°F | Wt 279.0 lb

## 2023-04-25 DIAGNOSIS — M7989 Other specified soft tissue disorders: Secondary | ICD-10-CM | POA: Insufficient documentation

## 2023-04-25 DIAGNOSIS — I872 Venous insufficiency (chronic) (peripheral): Secondary | ICD-10-CM | POA: Diagnosis not present

## 2023-04-25 NOTE — Progress Notes (Signed)
Office Note   History of Present Illness   Mark Gray is a 52 y.o. ([birth year]) male who presents for repeat evaluation of bilateral lower extremity swelling, left greater than right.  He has a 10+ year history of leg swelling.  His leg swelling is normal in the mornings and worsens by the end of the day after spending time on his feet.  This causes his leg to feel very heavy and somewhat painful. He has noticed skin changes over time including skin thickening and discoloration.  He used to wear compression stockings daily, but did not benefit from these much.  They also made his foot feel numb, so he recently stopped wearing them.  He still elevates his legs daily to try to help with swelling.  He is also trying to lose weight and has intentionally lost over 75 pounds in the past year.   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  At that time he also had thrombus in his left greater saphenous vein.  He just finished a 80-month course of Eliquis.   Past Medical History:  Diagnosis Date   Allergy    Asthma    DVT (deep venous thrombosis) (HCC)    LLE 07/2022 per patient   History of chickenpox     No past surgical history on file.  Social History   Socioeconomic History   Marital status: Married    Spouse name: Not on file   Number of children: Not on file   Years of education: Not on file   Highest education level: Not on file  Occupational History   Not on file  Tobacco Use   Smoking status: Never   Smokeless tobacco: Never  Vaping Use   Vaping status: Never Used  Substance and Sexual Activity   Alcohol use: Yes    Comment: 1-2 drinks per week occasionally   Drug use: Never   Sexual activity: Yes  Other Topics Concern   Not on file  Social History Narrative   Not on file   Social Determinants of Health   Financial Resource Strain: Not on file  Food Insecurity: Not on file   Transportation Needs: Not on file  Physical Activity: Not on file  Stress: Not on file  Social Connections: Not on file  Intimate Partner Violence: Not on file    Family History  Problem Relation Age of Onset   Cancer Mother    Heart attack Mother    CVA Father    Depression Son     Current Outpatient Medications  Medication Sig Dispense Refill   apixaban (ELIQUIS) 5 MG TABS tablet Take 1 tablet (5 mg total) by mouth 2 (two) times daily. 60 tablet 0   aspirin EC 81 MG tablet Take 81 mg by mouth daily. Swallow whole.     Cetirizine HCl (ZYRTEC PO) Take by mouth as needed.     Multiple Vitamins-Minerals (MULTIVITAMIN ADULTS PO) Take by mouth daily.     Current Facility-Administered Medications  Medication Dose Route Frequency Provider Last Rate Last Admin   triamcinolone acetonide (KENALOG) 10 MG/ML injection 10 mg  10 mg Other Once Vivi Barrack, DPM        Allergies  Allergen Reactions   Shellfish Allergy     Causes asthma attack    REVIEW OF SYSTEMS (negative unless checked):   Cardiac:  []  Chest pain  or chest pressure? []  Shortness of breath upon activity? []  Shortness of breath when lying flat? []  Irregular heart rhythm?  Vascular:  []  Pain in calf, thigh, or hip brought on by walking? []  Pain in feet at night that wakes you up from your sleep? [x]  Blood clot in your veins? [x]  Leg swelling?  Pulmonary:  []  Oxygen at home? []  Productive cough? []  Wheezing?  Neurologic:  []  Sudden weakness in arms or legs? []  Sudden numbness in arms or legs? []  Sudden onset of difficult speaking or slurred speech? []  Temporary loss of vision in one eye? []  Problems with dizziness?  Gastrointestinal:  []  Blood in stool? []  Vomited blood?  Genitourinary:  []  Burning when urinating? []  Blood in urine?  Psychiatric:  []  Major depression  Hematologic:  []  Bleeding problems? []  Problems with blood clotting?  Dermatologic:  []  Rashes or  ulcers?  Constitutional:  []  Fever or chills?  Ear/Nose/Throat:  []  Change in hearing? []  Nose bleeds? []  Sore throat?  Musculoskeletal:  []  Back pain? []  Joint pain? []  Muscle pain?   Physical Examination     Vitals:   04/25/23 1017  BP: (!) 144/99  Pulse: 62  Temp: 98 F (36.7 C)  TempSrc: Temporal  SpO2: 99%  Weight: 279 lb (126.6 kg)   Body mass index is 41.2 kg/m.  General:  WDWN in NAD; vital signs documented above Gait: Not observed HENT: WNL, normocephalic Pulmonary: normal non-labored breathing , without rales, rhonchi,  wheezing Cardiac: regular Abdomen: soft, NT, no masses Skin: without rashes Vascular Exam/Pulses: brisk bilateral DP/PT doppler signals Extremities: LLE with small varicose veins, moderate stasis pigmentation around the ankle, and significant nonpitting edema Musculoskeletal: no muscle wasting or atrophy  Neurologic: A&O X 3;  No focal weakness or paresthesias are detected Psychiatric:  The pt has Normal affect.       Non-invasive Vascular Imaging   LLE Venous Insufficiency Duplex (04/25/2023):  LEFT          Reflux  Reflux  Reflux  Diameter cmsComments                                No       Yes     Time                                         +--------------+--------+------+----------+------------+-------------------  ----+  CFV                   yes  >1 second                                       +--------------+--------+------+----------+------------+-------------------  ----+  FV prox       no                                                            +--------------+--------+------+----------+------------+-------------------  ----+  FV mid        no                                                            +--------------+--------+------+----------+------------+-------------------  ----+  FV dist       no                                                             +--------------+--------+------+----------+------------+-------------------  ----+  Popliteal             yes  >1 second                                       +--------------+--------+------+----------+------------+-------------------  ----+  GSV at SFJ             yes   >500 ms      1.09                              +--------------+--------+------+----------+------------+-------------------  ----+  GSV prox thigh         yes   >500 ms     0.419                              +--------------+--------+------+----------+------------+-------------------  ----+  GSV mid thigh          yes   >500 ms      0.41                              +--------------+--------+------+----------+------------+-------------------  ----+  GSV dist thigh         yes   >500 ms              partial chronic                                                             thrombus                  +--------------+--------+------+----------+------------+-------------------  ----+  GSV at knee                                       partial chronic                                                             thrombus                  +--------------+--------+------+----------+------------+-------------------  ----+  GSV prox calf                                     chronic thrombus          +--------------+--------+------+----------+------------+-------------------  ----+  GSV mid calf  chronic thrombus          +--------------+--------+------+----------+------------+-------------------  ----+  SSV Pop Fossa                            0.304                              +--------------+--------+------+----------+------------+-------------------  ----+  SSV prox calf                            0.249                               +--------------+--------+------+----------+------------+-------------------  ----+  SSV mid calf                             0.201                              +--------------+--------+------+----------+------------+-------------------    Medical Decision Making   Mark Gray is a 52 y.o. male who presents with bilateral lower extremity swelling, left greater than right  Based on the patient's duplex, there is reflux in the left common femoral vein, popliteal vein, and greater saphenous vein from the saphenofemoral junction to the distal thigh.  The remainder of the deep and superficial venous system is competent.  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 He has a 10+ year history of bilateral lower extremity swelling, left greater than right.  He recently finished a 47-month course of Eliquis for left popliteal and gastrocnemius vein DVT.  He has been elevating his legs above his heart, exercising, and trying to lose weight to help with his leg swelling.  He has lost over 75 pounds in the past year. He states his left leg still swells greatly, causing significant skin discoloration and heaviness On exam he has bilateral lower leg swelling, left greater than right.  His left lower leg has significant nonpitting edema with small varicose veins and hyperpigmentation.  Given the appearance of his swelling, potentially this is venous insufficiency and lymphedema/lipedema. He is very interested in pursuing further treatment to improve his leg swelling.  He could potentially be a candidate for left greater saphenous vein ablation. The patient understands that he may ultimately not be a candidate for saphenous vein ablation or may not benefit from the procedure.  He would still like to pursue this option.  He was measured for and given 20 to 30 mmHg thigh-high compression stockings.  He will wear these daily, continue weight loss and  exercise and follow-up with one of our MDs in 3 months.   Eleonore Shippee Sharin Mons, PA-C [facility] Office: [phone]  04/25/2023, 3:35 PM  Clinic MD: Karin Lieu

## 2023-05-16 DIAGNOSIS — M7989 Other specified soft tissue disorders: Secondary | ICD-10-CM

## 2023-07-23 ENCOUNTER — Ambulatory Visit: Payer: 59 | Admitting: Vascular Surgery

## 2023-07-28 ENCOUNTER — Ambulatory Visit: Payer: 59 | Admitting: Surgery

## 2023-08-25 ENCOUNTER — Ambulatory Visit: Payer: 59 | Admitting: Surgery

## 2023-08-25 ENCOUNTER — Encounter: Payer: Self-pay | Admitting: Surgery

## 2023-08-25 VITALS — BP 123/85 | HR 56 | Temp 98.5°F | Resp 20 | Ht 69.0 in | Wt 284.0 lb

## 2023-08-25 DIAGNOSIS — I890 Lymphedema, not elsewhere classified: Secondary | ICD-10-CM | POA: Diagnosis not present

## 2023-08-25 DIAGNOSIS — M7989 Other specified soft tissue disorders: Secondary | ICD-10-CM

## 2023-08-25 NOTE — Progress Notes (Signed)
 [facility]  Patient name: Mark Gray MRN: [id number] DOB: 04-25-1971 Sex: male   REASON FOR VISIT:    Follow up  HISOTRY OF PRESENT ILLNESS:    Mark Gray is a 53 y.o. male who returns for follow-up of bilateral lower extremity swelling, left greater than right which has been going on for many years.  He states that his swelling gets worse by the end of the day.  His legs feel heavy and painful.  He has also noticed some skin changes including color changes and thickening over the years.  In the past, he had stopped wearing compression socks because they made his feet feel numb.  He tries to keep his legs elevated when possible.  He is also losing weight, having lost 75 pounds in the last year.  He has been diagnosed with a left popliteal and gastrocnemius DVT.  He was also found to have thrombus in his left great saphenous vein.  He was treated with Eliquis  for 6 months.   PAST MEDICAL HISTORY:   Past Medical History:  Diagnosis Date   Allergy    Asthma    DVT (deep venous thrombosis) (HCC)    LLE 07/2022 per patient   History of chickenpox      FAMILY HISTORY:   Family History  Problem Relation Age of Onset   Cancer Mother    Heart attack Mother    CVA Father    Depression Son     SOCIAL HISTORY:   Social History   Tobacco Use   Smoking status: Never   Smokeless tobacco: Never  Substance Use Topics   Alcohol use: Yes    Comment: 1-2 drinks per week occasionally     ALLERGIES:   Allergies  Allergen Reactions   Shellfish Allergy     Causes asthma attack     CURRENT MEDICATIONS:   Current Outpatient Medications  Medication Sig Dispense Refill   aspirin EC 81 MG tablet Take 81 mg by mouth daily. Swallow whole.     Cetirizine HCl (ZYRTEC PO) Take by mouth as needed.     Multiple Vitamins-Minerals (MULTIVITAMIN ADULTS PO) Take by mouth daily.     Current  Facility-Administered Medications  Medication Dose Route Frequency Provider Last Rate Last Admin   triamcinolone  acetonide (KENALOG ) 10 MG/ML injection 10 mg  10 mg Other Once Gershon Donnice SAUNDERS, DPM        REVIEW OF SYSTEMS:   [X]  denotes positive finding, [ ]  denotes negative finding Cardiac  Comments:  Chest pain or chest pressure:    Shortness of breath upon exertion:    Short of breath when lying flat:    Irregular heart rhythm:        Vascular    Pain in calf, thigh, or hip brought on by ambulation:    Pain in feet at night that wakes you up from your sleep:     Blood clot in your veins:    Leg swelling:  x       Pulmonary    Oxygen at home:    Productive cough:     Wheezing:         Neurologic    Sudden weakness in arms or legs:     Sudden numbness in arms or legs:     Sudden onset of difficulty speaking or slurred speech:    Temporary loss of vision in one eye:     Problems with dizziness:  Gastrointestinal    Blood in stool:     Vomited blood:         Genitourinary    Burning when urinating:     Blood in urine:        Psychiatric    Major depression:         Hematologic    Bleeding problems:    Problems with blood clotting too easily:        Skin    Rashes or ulcers:        Constitutional    Fever or chills:      PHYSICAL EXAM:   Vitals:   08/25/23 1216  BP: 123/85  Pulse: (!) 56  Resp: 20  Temp: 98.5 F (36.9 C)  SpO2: 98%  Weight: 284 lb (128.8 kg)  Height: 5' 9 (1.753 m)    GENERAL: The patient is a well-nourished male, in no acute distress. The vital signs are documented above. CARDIAC: There is a regular rate and rhythm.  VASCULAR: SonoSite was used to evaluate the saphenous vein in the left leg which measures about 3 mm PULMONARY: Non-labored respirations ABDOMEN: Soft and non-tender with normal pitched bowel sounds.  MUSCULOSKELETAL: There are no major deformities or cyanosis. NEUROLOGIC: No focal weakness or  paresthesias are detected. PSYCHIATRIC: The patient has a normal affect.  STUDIES:   I have reviewed the following reflux study: +--------------+--------+------+----------+------------+-------------------  ----+  LEFT         Reflux  Reflux  Reflux  Diameter cmsComments                                No       Yes     Time                                         +--------------+--------+------+----------+------------+-------------------  ----+  CFV                   yes  >1 second                                       +--------------+--------+------+----------+------------+-------------------  ----+  FV prox       no                                                            +--------------+--------+------+----------+------------+-------------------  ----+  FV mid        no                                                            +--------------+--------+------+----------+------------+-------------------  ----+  FV dist       no                                                            +--------------+--------+------+----------+------------+-------------------  ----+  Popliteal             yes  >1 second                                       +--------------+--------+------+----------+------------+-------------------  ----+  GSV at [facility]             yes   >500 ms      1.09                              +--------------+--------+------+----------+------------+-------------------  ----+  GSV prox thigh         yes   >500 ms     0.419                              +--------------+--------+------+----------+------------+-------------------  ----+  GSV mid thigh          yes   >500 ms      0.41                              +--------------+--------+------+----------+------------+-------------------  ----+  GSV dist thigh         yes   >500 ms              partial chronic                                                              thrombus                  +--------------+--------+------+----------+------------+-------------------  ----+  GSV at knee                                       partial chronic                                                             thrombus                  +--------------+--------+------+----------+------------+-------------------  ----+  GSV prox calf                                     chronic thrombus          +--------------+--------+------+----------+------------+-------------------  ----+  GSV mid calf                                      chronic thrombus          +--------------+--------+------+----------+------------+-------------------  ----+  SSV Pop Fossa  0.304                              +--------------+--------+------+----------+------------+-------------------  ----+  SSV prox calf                            0.249                              +--------------+--------+------+----------+------------+-------------------  ----+  SSV mid calf                             0.201                              +--------------+--------+------+----------+------------+-------------------  ----+   MEDICAL ISSUES:   Left leg swelling: I discussed with the patient that I do not think that his venous insufficiency is contributing to his leg swelling.  His saphenous vein is not normal but it is very small and so I think it is very little to do with his leg swelling which I believe is mainly lymphedema.  I have recommended leg elevation, thigh-high compression socks, and referral to lymphedema therapist and consideration of a lymphedema pump.    Malvina Serene CLORE, MD, FACS [facility] [phone] Pager [phone]

## 2023-09-11 ENCOUNTER — Ambulatory Visit (HOSPITAL_COMMUNITY): Payer: 59 | Attending: Surgery | Admitting: Physical Therapy

## 2023-09-11 ENCOUNTER — Other Ambulatory Visit: Payer: Self-pay

## 2023-09-11 DIAGNOSIS — I890 Lymphedema, not elsewhere classified: Secondary | ICD-10-CM | POA: Diagnosis present

## 2023-09-11 NOTE — Therapy (Signed)
OUTPATIENT PHYSICAL THERAPY LYMPHEDEMA EVALUATION  Patient Name: Mark Gray MRN: [id number] DOB:23-Mar-1971, 53 y.o., male Today's Date: 09/11/2023  END OF SESSION:  PT End of Session - 09/11/23 1444     Visit Number 1    Number of Visits 1    Authorization Type [employer]    PT Start Time 1000    PT Stop Time 1105    PT Time Calculation (min) 65 min    Activity Tolerance Patient tolerated treatment well    Behavior During Therapy WFL for tasks assessed/performed             Past Medical History:  Diagnosis Date   Allergy    Asthma    DVT (deep venous thrombosis) (HCC)    LLE 07/2022 per patient   History of chickenpox    No past surgical history on file. Patient Active Problem List   Diagnosis Date Noted   Acute deep vein thrombosis (DVT) of popliteal vein of left lower extremity (HCC) 09/20/2022   Acute superficial venous thrombosis of lower extremity, left 08/26/2022   Venous insufficiency of both lower extremities 08/26/2022   Plantar fasciitis, right 01/23/2018    PCP: Nira Conn  REFERRING PROVIDER: Coral Else  REFERRING DIAG: lymphedema  THERAPY DIAG:  Lymphedema, not elsewhere classified  Rationale for Evaluation and Treatment: Rehabilitation  ONSET DATE: chronic  SUBJECTIVE:                                                                                                                                                                                           SUBJECTIVE STATEMENT:  Pt states that he lives in [place] and does not want recurring visits.  The pt states that he has been having swelling for a decade now, he has gone to multiple MD's who just stated that he needs to lose weight but he has lost 80# and is still having the swelling. The swelling is not in his thighs.  He wore compression garments for years and has been elevating without improvement.  Pt states that he walks but is not extremely active.   PERTINENT  HISTORY: bilateral lower extremity swelling, left greater than right which has been going on for many years. He states that his swelling gets worse by the end of the day. His legs feel heavy and painful. He has also noticed some skin changes including color changes and thickening over the years.  PAIN:  Are you having pain? No  PRECAUTIONS: Other: cellulits    WEIGHT BEARING RESTRICTIONS: No  FALLS:  Has patient fallen in last 6 months? No  LIVING ENVIRONMENT: Lives with: lives with  their family OCCUPATION: Charity fundraiser PATIENT GOALS: Find a way to decrease the size of his left leg    OBJECTIVE: Note: Objective measures were completed at Evaluation unless otherwise noted.  COGNITION: Overall cognitive status: Within functional limits for tasks assessed    OBSERVATIONS / OTHER ASSESSMENTS: noted skin tone changes and thickening of skin  LYMPHEDEMA ASSESSMENTS:   SURGERY TYPE/DATE: none   LYMPHEDEMA ASSESSMENTS:     LE LANDMARK RIGHT eval  At groin   30 cm proximal to suprapatella   20 cm proximal to suprapatella 69.5  10 cm proximal to suprapatella 64.2  At midpatella / popliteal crease 45.7  30 cm proximal to floor at lateral plantar foot 44.3  20 cm proximal to floor at lateral plantar foot 35  10 cm proximal to floor at lateral plantar foot 24.8  Circumference of ankle/heel 33.9  5 cm proximal to 1st MTP joint 24  Across MTP joint 24  Around proximal great toe 8.2  (Blank rows = not tested)  LE LANDMARK LEFT eval  At groin   30 cm proximal to suprapatella   20 cm proximal to suprapatella 68.3  10 cm proximal to suprapatella 62  At midpatella / popliteal crease 49  30 cm proximal to floor at lateral plantar foot    20 cm proximal to floor at lateral plantar foot   10 cm proximal to floor at lateral plantar foot 34.8  Circumference of ankle/heel 35.8  5 cm proximal to 1st MTP joint 24.8  Across MTP joint 24.4  Around proximal great toe 8.4  (Blank rows =  not tested)  TODAY'S TREATMENT:                                                                                                                              DATE: Evaluation and education    PATIENT EDUCATION:  Education details: Four aspects for controlling lymphedema I)skin care, II)exercise- shown exercises and given sheet , III)manual instructed in self manual and given sheet, IV)compression  Person educated: Patient Education method: Explanation, Verbal cues, and Handouts Education comprehension: verbalized understanding  HOME EXERCISE PROGRAM: II)exercise- shown exercises and given sheet , III)manual instructed in self manual and given sheet  ASSESSMENT:  CLINICAL IMPRESSION: Patient is a 53 y.o. male who was seen today for physical therapy evaluation and treatment for lymphedema.  Evaluation demonstrates pt with noted varicosities and significant edema in his LT LE.  Mr. Munier lives quite a ways from the clinic and would like to attempt self manual, pumping and exercising to reduce his edema prior to jumping into total decongestive techniques.  The pt has been using compression garments given to him at the vein clinic and elevating already.  The pt was educated on skin care, exercises and self manual and voices no questions.  The pt will be discharged at this time.    OBJECTIVE IMPAIRMENTS: decreased activity tolerance and increased edema.   ACTIVITY LIMITATIONS: dressing,  hygiene/grooming, and locomotion level  PERSONAL FACTORS: Time since onset of injury/illness/exacerbation are also affecting patient's functional outcome.   REHAB POTENTIAL: Good  CLINICAL DECISION MAKING: Evolving/moderate complexity  EVALUATION COMPLEXITY: Moderate  GOALS: Goals reviewed with patient? No  SHORT TERM GOALS: Target date: 09/11/23  PT to be I in skin care, HEP and self manual  Baseline: Goal status: INITIAL   LONG TERM GOALS: Target date: 09/25/22  PT to have obtained a compression  pump to be using on a daily basis to assist in decreasing Lt LE edema.  Baseline:  Goal status: INITIAL   PLAN:  PT FREQUENCY: 1x/week  PT DURATION: 1 week  PLANNED INTERVENTIONS: 97110-Therapeutic exercises, 97535- Self Care, and 65784- Manual therapy  PLAN FOR NEXT SESSION: Discharge to [facility], PT CLT [phone]  09/11/2023, 2:48 PM

## 2024-09-24 ENCOUNTER — Emergency Department (HOSPITAL_BASED_OUTPATIENT_CLINIC_OR_DEPARTMENT_OTHER)
Admission: EM | Admit: 2024-09-24 | Discharge: 2024-09-24 | Disposition: A | Discharge status: Home / Self Care | Source: Home / Self Care | Attending: Emergency Medicine | Admitting: Emergency Medicine

## 2024-09-24 ENCOUNTER — Emergency Department (HOSPITAL_BASED_OUTPATIENT_CLINIC_OR_DEPARTMENT_OTHER)

## 2024-09-24 ENCOUNTER — Other Ambulatory Visit: Payer: Self-pay

## 2024-09-24 DIAGNOSIS — I480 Paroxysmal atrial fibrillation: Secondary | ICD-10-CM

## 2024-09-24 DIAGNOSIS — R911 Solitary pulmonary nodule: Secondary | ICD-10-CM

## 2024-09-24 LAB — CBC
HCT: 39.6 % (ref 39.0–52.0)
Hemoglobin: 12 g/dL — ABNORMAL LOW (ref 13.0–17.0)
MCH: 24.5 pg — ABNORMAL LOW (ref 26.0–34.0)
MCHC: 30.3 g/dL (ref 30.0–36.0)
MCV: 81 fL (ref 80.0–100.0)
Platelets: 349 10*3/uL (ref 150–400)
RBC: 4.89 MIL/uL (ref 4.22–5.81)
RDW: 14.4 % (ref 11.5–15.5)
WBC: 5.8 10*3/uL (ref 4.0–10.5)
nRBC: 0 % (ref 0.0–0.2)

## 2024-09-24 LAB — BASIC METABOLIC PANEL WITH GFR
Anion gap: 10 (ref 5–15)
BUN: 20 mg/dL (ref 6–20)
CO2: 25 mmol/L (ref 22–32)
Calcium: 8.9 mg/dL (ref 8.9–10.3)
Chloride: 104 mmol/L (ref 98–111)
Creatinine, Ser: 1.03 mg/dL (ref 0.61–1.24)
GFR, Estimated: >60 mL/min
Glucose, Bld: 92 mg/dL (ref 70–99)
Potassium: 4.3 mmol/L (ref 3.5–5.1)
Sodium: 139 mmol/L (ref 135–145)

## 2024-09-24 LAB — MAGNESIUM: Magnesium: 2.4 mg/dL (ref 1.7–2.4)

## 2024-09-24 MED ORDER — SODIUM CHLORIDE 0.9 % IV BOLUS
500.0000 mL | Freq: Once | INTRAVENOUS | Status: AC
  Administered 2024-09-24: 500 mL via INTRAVENOUS

## 2024-09-24 MED ORDER — CARVEDILOL 6.25 MG PO TABS: 6.2500 mg | ORAL_TABLET | Freq: Two times a day (BID) | ORAL | 0 refills | Status: AC

## 2024-09-24 MED ORDER — APIXABAN 5 MG PO TABS: 5.0000 mg | ORAL_TABLET | Freq: Two times a day (BID) | ORAL | 0 refills | Status: AC

## 2024-09-24 NOTE — ED Notes (Signed)
 ED Provider at bedside.

## 2024-09-24 NOTE — Discharge Instructions (Addendum)
 We have prescribed the new medications, the Eliquis  which is a blood thinner that you should take twice daily, as well as Coreg  which is the blood pressure/heart rate control medication.  You can take this twice daily as well, with meals.  I placed a referral to the A-fib clinic for follow-up.  If you have significant worsening heart palpitations, chest pain, shortness of breath, concern for complication with medication, or other concern, please return to ED for further evaluation.  They noted an incidental pulmonary nodule on your chest x-ray, with the radiologist recommending a nonemergent chest CT to follow-up and further evaluate this lesion.

## 2024-09-24 NOTE — ED Triage Notes (Signed)
 Pt Mark Gray ambulatory reporting he started feeling palpitations last night while laying in bed, had a short episode of dizziness approx 10 sec, had an ECG done by health at work which showed afib and was told to go to ED d/t concern for blood clots with hx DVT. Denies CP and SOB.

## 2024-09-24 NOTE — ED Provider Notes (Cosign Needed)
 " [place] EMERGENCY DEPARTMENT AT [facility] Provider Note   CSN: [id number] Arrival date & time: 09/24/24  1245     Patient presents with: Palpitations   Mark Gray is a 54 y.o. male with past medical history significant for obesity, previous DVT, venous insufficiency of bilateral lower extremities presents with concern for new onset heart palpitations, malaise.  Patient reports that he was laying in bed around 1015 last night.  Felt irregular heartbeat start.  Ongoing symptoms today, denies any chest pain, shortness of breath.  He reports some very mild dizziness while climbing a flight of stairs.  No previous history of A-fib.  He denies any heavy drinking, recent binge drinking.  No recent fever, chills.    Palpitations      Prior to Admission medications  Medication Sig Start Date End Date Taking? Authorizing Provider  apixaban  (ELIQUIS ) 5 MG TABS tablet Take 1 tablet (5 mg total) by mouth 2 (two) times daily. 09/24/24  Yes Sky Primo H, PA-C  carvedilol  (COREG ) 6.25 MG tablet Take 1 tablet (6.25 mg total) by mouth 2 (two) times daily with a meal. 09/24/24  Yes Melora Menon H, PA-C  aspirin EC 81 MG tablet Take 81 mg by mouth daily. Swallow whole.    [provider]  Cetirizine HCl (ZYRTEC PO) Take by mouth as needed.    [provider]  Multiple Vitamins-Minerals (MULTIVITAMIN ADULTS PO) Take by mouth daily.    [provider]    Allergies: Shellfish allergy    Review of Systems  Cardiovascular:  Positive for palpitations.  All other systems reviewed and are negative.   Updated Vital Signs BP (!) 150/127   Pulse 99   Temp 98.5 F (36.9 C) (Oral)   Resp 14   SpO2 99%   Physical Exam Vitals and nursing note reviewed.  Constitutional:      General: He is not in acute distress.    Appearance: Normal appearance.  HENT:     Head: Normocephalic and atraumatic.  Eyes:     General:        Right eye: No  discharge.        Left eye: No discharge.  Cardiovascular:     Rate and Rhythm: Tachycardia present. Rhythm irregular.     Heart sounds: No murmur heard.    No friction rub. No gallop.  Pulmonary:     Effort: Pulmonary effort is normal.     Breath sounds: Normal breath sounds.  Abdominal:     General: Bowel sounds are normal.     Palpations: Abdomen is soft.  Musculoskeletal:     Comments: Bilateral lower extremity edema, patient reports chronic  Skin:    General: Skin is warm and dry.     Capillary Refill: Capillary refill takes less than 2 seconds.  Neurological:     Mental Status: He is alert and oriented to person, place, and time.  Psychiatric:        Mood and Affect: Mood normal.        Behavior: Behavior normal.     (all labs ordered are listed, but only abnormal results are displayed) Labs Reviewed  CBC - Abnormal; Notable for the following components:      Result Value   Hemoglobin 12.0 (*)    MCH 24.5 (*)    All other components within normal limits  BASIC METABOLIC PANEL WITH GFR  MAGNESIUM    EKG: EKG Interpretation Date/Time:  Friday September 24 2024 12:56:20  EST Ventricular Rate:  98 PR Interval:    QRS Duration:  100 QT Interval:  336 QTC Calculation: 429 R Axis:   27  Text Interpretation: Atrial fibrillation No old tracing to compare Confirmed by Lenor Hollering [phone]) on 09/24/2024 1:17:28 PM  Radiology: DG Chest Portable 1 View Result Date: 09/24/2024 CLINICAL DATA:  Palpitations and shortness of breath EXAM: PORTABLE CHEST 1 VIEW COMPARISON:  None Available. FINDINGS: Normal lung volumes. Rounded nodular density projects over the lateral right upper lung. Prominent left hilar nodular densities may represent vessels in cross-section. Rounded left retrocardiac opacity may represent hiatal hernia. Patchy left basilar opacity. No pleural effusion or pneumothorax. The heart size and mediastinal contours are within normal limits. Aortic atherosclerosis. No  acute osseous abnormality. IMPRESSION: 1. Rounded nodular density projects over the lateral right upper lung may represent a pulmonary nodule. Recommend further evaluation with nonemergent chest CT. 2. Patchy left basilar opacity may represent atelectasis or pneumonia. 3. Rounded left retrocardiac opacity may represent hiatal hernia. Electronically Signed   By: Limin  Xu M.D.   On: 09/24/2024 14:35     Procedures   Medications Ordered in the ED  sodium chloride  0.9 % bolus 500 mL (0 mLs Intravenous Stopped 09/24/24 1401)                                    Medical Decision Making Amount and/or Complexity of Data Reviewed Labs: ordered. Radiology: ordered.   This patient is a 54 y.o. male  who presents to the ED for concern of palpitations.   Differential diagnoses prior to evaluation: The emergent differential diagnosis includes, but is not limited to,  The differential diagnosis for palpitations includes cardiac arrhythmias, PVC/PAC, ACS, Cardiomyopathy, CHF, MVP, pericarditis, valvular disease, Panic/Anxiety, Somatic disorder, ETOH, Caffeine,  Stimulant use, medication side effect, Anemia, Hyperthyroidism, pulmonary embolism. . This is not an exhaustive differential.   Past Medical History / Co-morbidities / Social History: obesity, previous DVT, venous insufficiency of bilateral lower extremities  Physical Exam: Physical exam performed. The pertinent findings include: Vital signs overall stable in the emergency department, patient does have elevated diastolic blood pressure, elevated systolic blood pressure, BP 150/127, he has irregularly irregular heart rhythm with overall normal rate, on average around 80 to  Lab Tests/Imaging studies: I personally interpreted labs/imaging and the pertinent results include: CBC overall unremarkable, very mild anemia, hemoglobin 12.0.  BMP unremarkable, magnesium normal, plain film chest x-ray with incidental right upper lobe nodule, with  recommendation for outpatient evaluation, chest CT, question hiatal hernia, left lower lobe with report of atelectasis versus pneumonia.  Patient with no cough, fever, or other concerning findings for pneumonia, in context of this I am most suspicious for atelectasis. I agree with the radiologist interpretation.  Cardiac monitoring: EKG obtained and interpreted by myself and attending physician which shows: Atrial fibrillation   Consults: I spoke with cardiology, recommends outpatient follow-up with A-fib clinic, carvedilol  6.25 twice daily, and Eliquis    Disposition: After consideration of the diagnostic results and the patients response to treatment, I feel that patient stable for discharge, he is rate controlled in the emergency department, will begin Eliquis , carvedilol , return precautions given.   emergency department workup does not suggest an emergent condition requiring admission or immediate intervention beyond what has been performed at this time. The plan is: as above. The patient is safe for discharge and has been instructed to return immediately for  worsening symptoms, change in symptoms or any other concerns.   Final diagnoses:  Paroxysmal atrial fibrillation [facility])  Pulmonary nodule    ED Discharge Orders          Ordered    Amb Referral to AFIB Clinic        09/24/24 1428    carvedilol  (COREG ) 6.25 MG tablet  2 times daily with meals        09/24/24 1428    apixaban  (ELIQUIS ) 5 MG TABS tablet  2 times daily        09/24/24 1428               Camora Tremain, [place] H, PA-C 09/24/24 1459  "
